# Patient Record
Sex: Female | Born: 1975 | Race: Black or African American | Hispanic: No | Marital: Married | Smoking: Never smoker
Health system: Southern US, Community
[De-identification: ages and names within clinical notes are randomized; demographics above are authoritative.]

## PROBLEM LIST (undated history)

## (undated) HISTORY — PX: TUBAL LIGATION: SHX77

## (undated) HISTORY — PX: BREAST SURGERY: SHX581

---

## 1997-08-05 ENCOUNTER — Observation Stay (HOSPITAL_COMMUNITY): Admission: AD | Admit: 1997-08-05 | Discharge: 1997-08-05 | Payer: Self-pay | Admitting: Obstetrics & Gynecology

## 1997-08-05 ENCOUNTER — Inpatient Hospital Stay (HOSPITAL_COMMUNITY): Admission: AD | Admit: 1997-08-05 | Discharge: 1997-08-05 | Payer: Self-pay | Admitting: Obstetrics and Gynecology

## 1997-08-11 ENCOUNTER — Inpatient Hospital Stay (HOSPITAL_COMMUNITY): Admission: AD | Admit: 1997-08-11 | Discharge: 1997-08-11 | Payer: Self-pay | Admitting: Obstetrics & Gynecology

## 1997-08-15 ENCOUNTER — Inpatient Hospital Stay (HOSPITAL_COMMUNITY): Admission: AD | Admit: 1997-08-15 | Discharge: 1997-08-18 | Payer: Self-pay | Admitting: Obstetrics & Gynecology

## 1997-08-27 ENCOUNTER — Observation Stay (HOSPITAL_COMMUNITY): Admission: AD | Admit: 1997-08-27 | Discharge: 1997-08-28 | Payer: Self-pay | Admitting: *Deleted

## 1997-08-31 ENCOUNTER — Other Ambulatory Visit: Admission: RE | Admit: 1997-08-31 | Discharge: 1997-08-31 | Payer: Self-pay | Admitting: Obstetrics & Gynecology

## 1997-09-03 ENCOUNTER — Inpatient Hospital Stay (HOSPITAL_COMMUNITY): Admission: AD | Admit: 1997-09-03 | Discharge: 1997-09-03 | Payer: Self-pay | Admitting: *Deleted

## 1997-09-05 ENCOUNTER — Emergency Department (HOSPITAL_COMMUNITY): Admission: EM | Admit: 1997-09-05 | Discharge: 1997-09-05 | Payer: Self-pay | Admitting: Emergency Medicine

## 1997-09-06 ENCOUNTER — Inpatient Hospital Stay (HOSPITAL_COMMUNITY): Admission: AD | Admit: 1997-09-06 | Discharge: 1997-09-06 | Payer: Self-pay | Admitting: Obstetrics and Gynecology

## 1997-09-08 ENCOUNTER — Inpatient Hospital Stay (HOSPITAL_COMMUNITY): Admission: AD | Admit: 1997-09-08 | Discharge: 1997-09-15 | Payer: Self-pay

## 1997-09-17 ENCOUNTER — Ambulatory Visit (HOSPITAL_COMMUNITY): Admission: RE | Admit: 1997-09-17 | Discharge: 1997-09-17 | Payer: Self-pay | Admitting: Obstetrics and Gynecology

## 1997-09-17 ENCOUNTER — Ambulatory Visit (HOSPITAL_COMMUNITY): Admission: RE | Admit: 1997-09-17 | Discharge: 1997-09-17 | Payer: Self-pay | Admitting: Obstetrics & Gynecology

## 1997-09-17 ENCOUNTER — Inpatient Hospital Stay (HOSPITAL_COMMUNITY): Admission: AD | Admit: 1997-09-17 | Discharge: 1997-09-17 | Payer: Self-pay | Admitting: *Deleted

## 1997-09-21 ENCOUNTER — Ambulatory Visit (HOSPITAL_COMMUNITY): Admission: RE | Admit: 1997-09-21 | Discharge: 1997-09-21 | Payer: Self-pay | Admitting: Obstetrics and Gynecology

## 1997-09-24 ENCOUNTER — Ambulatory Visit (HOSPITAL_COMMUNITY): Admission: RE | Admit: 1997-09-24 | Discharge: 1997-09-24 | Payer: Self-pay | Admitting: Obstetrics and Gynecology

## 1997-09-28 ENCOUNTER — Ambulatory Visit (HOSPITAL_COMMUNITY): Admission: RE | Admit: 1997-09-28 | Discharge: 1997-09-28 | Payer: Self-pay | Admitting: Obstetrics and Gynecology

## 1997-10-01 ENCOUNTER — Ambulatory Visit (HOSPITAL_COMMUNITY): Admission: RE | Admit: 1997-10-01 | Discharge: 1997-10-01 | Payer: Self-pay | Admitting: Obstetrics and Gynecology

## 1997-10-05 ENCOUNTER — Ambulatory Visit (HOSPITAL_COMMUNITY): Admission: RE | Admit: 1997-10-05 | Discharge: 1997-10-05 | Payer: Self-pay | Admitting: Obstetrics and Gynecology

## 1997-10-11 ENCOUNTER — Inpatient Hospital Stay (HOSPITAL_COMMUNITY): Admission: AD | Admit: 1997-10-11 | Discharge: 1997-10-11 | Payer: Self-pay | Admitting: Obstetrics & Gynecology

## 1997-10-13 ENCOUNTER — Inpatient Hospital Stay (HOSPITAL_COMMUNITY): Admission: AD | Admit: 1997-10-13 | Discharge: 1997-10-13 | Payer: Self-pay | Admitting: Obstetrics and Gynecology

## 1997-10-13 ENCOUNTER — Encounter: Payer: Self-pay | Admitting: Obstetrics and Gynecology

## 1997-10-17 ENCOUNTER — Inpatient Hospital Stay (HOSPITAL_COMMUNITY): Admission: AD | Admit: 1997-10-17 | Discharge: 1997-10-17 | Payer: Self-pay | Admitting: Obstetrics and Gynecology

## 1997-10-29 ENCOUNTER — Ambulatory Visit (HOSPITAL_COMMUNITY): Admission: RE | Admit: 1997-10-29 | Discharge: 1997-10-29 | Payer: Self-pay | Admitting: Obstetrics & Gynecology

## 1997-11-25 ENCOUNTER — Inpatient Hospital Stay (HOSPITAL_COMMUNITY): Admission: AD | Admit: 1997-11-25 | Discharge: 1997-11-25 | Payer: Self-pay | Admitting: Obstetrics and Gynecology

## 1997-12-03 ENCOUNTER — Observation Stay (HOSPITAL_COMMUNITY): Admission: AD | Admit: 1997-12-03 | Discharge: 1997-12-04 | Payer: Self-pay | Admitting: Obstetrics & Gynecology

## 1997-12-11 ENCOUNTER — Inpatient Hospital Stay (HOSPITAL_COMMUNITY): Admission: AD | Admit: 1997-12-11 | Discharge: 1997-12-11 | Payer: Self-pay | Admitting: Obstetrics & Gynecology

## 1997-12-22 ENCOUNTER — Observation Stay (HOSPITAL_COMMUNITY): Admission: AD | Admit: 1997-12-22 | Discharge: 1997-12-23 | Payer: Self-pay | Admitting: Obstetrics and Gynecology

## 1997-12-26 ENCOUNTER — Inpatient Hospital Stay (HOSPITAL_COMMUNITY): Admission: AD | Admit: 1997-12-26 | Discharge: 1997-12-27 | Payer: Self-pay | Admitting: Obstetrics & Gynecology

## 1998-01-06 ENCOUNTER — Inpatient Hospital Stay (HOSPITAL_COMMUNITY): Admission: AD | Admit: 1998-01-06 | Discharge: 1998-01-06 | Payer: Self-pay | Admitting: Obstetrics & Gynecology

## 1998-01-11 ENCOUNTER — Ambulatory Visit (HOSPITAL_COMMUNITY): Admission: RE | Admit: 1998-01-11 | Discharge: 1998-01-11 | Payer: Self-pay | Admitting: Obstetrics & Gynecology

## 1998-01-15 ENCOUNTER — Inpatient Hospital Stay (HOSPITAL_COMMUNITY): Admission: AD | Admit: 1998-01-15 | Discharge: 1998-01-23 | Payer: Self-pay | Admitting: Obstetrics and Gynecology

## 1998-01-16 ENCOUNTER — Encounter: Payer: Self-pay | Admitting: Obstetrics and Gynecology

## 1998-01-27 ENCOUNTER — Inpatient Hospital Stay (HOSPITAL_COMMUNITY): Admission: AD | Admit: 1998-01-27 | Discharge: 1998-02-03 | Payer: Self-pay | Admitting: Obstetrics and Gynecology

## 1998-01-27 ENCOUNTER — Encounter: Payer: Self-pay | Admitting: Obstetrics and Gynecology

## 1998-02-25 ENCOUNTER — Inpatient Hospital Stay (HOSPITAL_COMMUNITY): Admission: AD | Admit: 1998-02-25 | Discharge: 1998-02-27 | Payer: Self-pay | Admitting: *Deleted

## 1998-05-07 ENCOUNTER — Inpatient Hospital Stay (HOSPITAL_COMMUNITY): Admission: AD | Admit: 1998-05-07 | Discharge: 1998-05-07 | Payer: Self-pay | Admitting: Obstetrics & Gynecology

## 1998-09-06 ENCOUNTER — Observation Stay (HOSPITAL_COMMUNITY): Admission: AD | Admit: 1998-09-06 | Discharge: 1998-09-07 | Payer: Self-pay | Admitting: Obstetrics and Gynecology

## 1998-09-12 ENCOUNTER — Encounter: Payer: Self-pay | Admitting: Obstetrics & Gynecology

## 1998-09-12 ENCOUNTER — Inpatient Hospital Stay (HOSPITAL_COMMUNITY): Admission: AD | Admit: 1998-09-12 | Discharge: 1998-09-18 | Payer: Self-pay | Admitting: Obstetrics & Gynecology

## 1998-09-28 ENCOUNTER — Inpatient Hospital Stay (HOSPITAL_COMMUNITY): Admission: AD | Admit: 1998-09-28 | Discharge: 1998-10-03 | Payer: Self-pay | Admitting: Obstetrics & Gynecology

## 1998-10-11 ENCOUNTER — Other Ambulatory Visit: Admission: RE | Admit: 1998-10-11 | Discharge: 1998-10-11 | Payer: Self-pay | Admitting: *Deleted

## 1998-10-15 ENCOUNTER — Inpatient Hospital Stay (HOSPITAL_COMMUNITY): Admission: AD | Admit: 1998-10-15 | Discharge: 1998-10-20 | Payer: Self-pay | Admitting: Obstetrics and Gynecology

## 1998-10-17 ENCOUNTER — Encounter: Payer: Self-pay | Admitting: Obstetrics and Gynecology

## 1998-11-06 ENCOUNTER — Inpatient Hospital Stay (HOSPITAL_COMMUNITY): Admission: AD | Admit: 1998-11-06 | Discharge: 1998-11-06 | Payer: Self-pay | Admitting: Obstetrics and Gynecology

## 1998-11-09 ENCOUNTER — Inpatient Hospital Stay (HOSPITAL_COMMUNITY): Admission: AD | Admit: 1998-11-09 | Discharge: 1998-11-09 | Payer: Self-pay | Admitting: *Deleted

## 1998-11-13 ENCOUNTER — Inpatient Hospital Stay (HOSPITAL_COMMUNITY): Admission: AD | Admit: 1998-11-13 | Discharge: 1998-11-13 | Payer: Self-pay | Admitting: *Deleted

## 1999-03-05 ENCOUNTER — Inpatient Hospital Stay (HOSPITAL_COMMUNITY): Admission: AD | Admit: 1999-03-05 | Discharge: 1999-03-05 | Payer: Self-pay | Admitting: *Deleted

## 1999-03-14 ENCOUNTER — Inpatient Hospital Stay (HOSPITAL_COMMUNITY): Admission: AD | Admit: 1999-03-14 | Discharge: 1999-03-14 | Payer: Self-pay | Admitting: *Deleted

## 1999-03-30 ENCOUNTER — Encounter (INDEPENDENT_AMBULATORY_CARE_PROVIDER_SITE_OTHER): Payer: Self-pay | Admitting: Specialist

## 1999-03-30 ENCOUNTER — Inpatient Hospital Stay (HOSPITAL_COMMUNITY): Admission: AD | Admit: 1999-03-30 | Discharge: 1999-04-05 | Payer: Self-pay | Admitting: Obstetrics & Gynecology

## 2000-02-01 ENCOUNTER — Other Ambulatory Visit: Admission: RE | Admit: 2000-02-01 | Discharge: 2000-02-01 | Payer: Self-pay | Admitting: Obstetrics and Gynecology

## 2000-06-19 ENCOUNTER — Encounter (INDEPENDENT_AMBULATORY_CARE_PROVIDER_SITE_OTHER): Payer: Self-pay | Admitting: Specialist

## 2000-06-19 ENCOUNTER — Ambulatory Visit (HOSPITAL_COMMUNITY): Admission: RE | Admit: 2000-06-19 | Discharge: 2000-06-19 | Payer: Self-pay | Admitting: *Deleted

## 2001-03-08 ENCOUNTER — Emergency Department (HOSPITAL_COMMUNITY): Admission: EM | Admit: 2001-03-08 | Discharge: 2001-03-09 | Payer: Self-pay | Admitting: Emergency Medicine

## 2003-05-02 ENCOUNTER — Inpatient Hospital Stay (HOSPITAL_COMMUNITY): Admission: AD | Admit: 2003-05-02 | Discharge: 2003-05-03 | Payer: Self-pay | Admitting: Obstetrics & Gynecology

## 2003-05-21 ENCOUNTER — Other Ambulatory Visit: Admission: RE | Admit: 2003-05-21 | Discharge: 2003-05-21 | Payer: Self-pay | Admitting: Obstetrics and Gynecology

## 2006-04-28 ENCOUNTER — Inpatient Hospital Stay (HOSPITAL_COMMUNITY): Admission: AD | Admit: 2006-04-28 | Discharge: 2006-04-29 | Payer: Self-pay | Admitting: Obstetrics & Gynecology

## 2006-12-22 ENCOUNTER — Inpatient Hospital Stay (HOSPITAL_COMMUNITY): Admission: AD | Admit: 2006-12-22 | Discharge: 2006-12-22 | Payer: Self-pay | Admitting: Obstetrics & Gynecology

## 2006-12-27 ENCOUNTER — Inpatient Hospital Stay (HOSPITAL_COMMUNITY): Admission: AD | Admit: 2006-12-27 | Discharge: 2006-12-31 | Payer: Self-pay | Admitting: Obstetrics & Gynecology

## 2007-01-05 ENCOUNTER — Inpatient Hospital Stay (HOSPITAL_COMMUNITY): Admission: AD | Admit: 2007-01-05 | Discharge: 2007-01-07 | Payer: Self-pay | Admitting: Obstetrics

## 2007-01-15 ENCOUNTER — Inpatient Hospital Stay (HOSPITAL_COMMUNITY): Admission: AD | Admit: 2007-01-15 | Discharge: 2007-01-18 | Payer: Self-pay | Admitting: Obstetrics

## 2007-01-20 ENCOUNTER — Inpatient Hospital Stay (HOSPITAL_COMMUNITY): Admission: AD | Admit: 2007-01-20 | Discharge: 2007-01-23 | Payer: Self-pay | Admitting: Obstetrics

## 2007-02-04 ENCOUNTER — Inpatient Hospital Stay (HOSPITAL_COMMUNITY): Admission: AD | Admit: 2007-02-04 | Discharge: 2007-02-05 | Payer: Self-pay | Admitting: Obstetrics

## 2007-02-12 ENCOUNTER — Inpatient Hospital Stay (HOSPITAL_COMMUNITY): Admission: AD | Admit: 2007-02-12 | Discharge: 2007-02-16 | Payer: Self-pay | Admitting: Obstetrics & Gynecology

## 2007-02-23 ENCOUNTER — Observation Stay (HOSPITAL_COMMUNITY): Admission: AD | Admit: 2007-02-23 | Discharge: 2007-02-24 | Payer: Self-pay | Admitting: Obstetrics

## 2007-03-07 ENCOUNTER — Ambulatory Visit (HOSPITAL_COMMUNITY): Admission: RE | Admit: 2007-03-07 | Discharge: 2007-03-07 | Payer: Self-pay | Admitting: Obstetrics

## 2007-03-07 ENCOUNTER — Inpatient Hospital Stay (HOSPITAL_COMMUNITY): Admission: AD | Admit: 2007-03-07 | Discharge: 2007-03-08 | Payer: Self-pay | Admitting: Obstetrics & Gynecology

## 2007-05-14 ENCOUNTER — Ambulatory Visit (HOSPITAL_COMMUNITY): Admission: RE | Admit: 2007-05-14 | Discharge: 2007-05-14 | Payer: Self-pay | Admitting: Obstetrics

## 2007-06-05 ENCOUNTER — Ambulatory Visit (HOSPITAL_COMMUNITY): Admission: RE | Admit: 2007-06-05 | Discharge: 2007-06-05 | Payer: Self-pay | Admitting: Obstetrics

## 2007-06-12 ENCOUNTER — Ambulatory Visit (HOSPITAL_COMMUNITY): Admission: RE | Admit: 2007-06-12 | Discharge: 2007-06-12 | Payer: Self-pay | Admitting: Obstetrics

## 2007-06-13 ENCOUNTER — Ambulatory Visit (HOSPITAL_COMMUNITY): Admission: RE | Admit: 2007-06-13 | Discharge: 2007-06-13 | Payer: Self-pay | Admitting: Obstetrics

## 2007-06-17 ENCOUNTER — Ambulatory Visit (HOSPITAL_COMMUNITY): Admission: RE | Admit: 2007-06-17 | Discharge: 2007-06-17 | Payer: Self-pay | Admitting: Obstetrics

## 2007-06-20 ENCOUNTER — Ambulatory Visit (HOSPITAL_COMMUNITY): Admission: RE | Admit: 2007-06-20 | Discharge: 2007-06-20 | Payer: Self-pay | Admitting: Obstetrics

## 2007-06-24 ENCOUNTER — Ambulatory Visit (HOSPITAL_COMMUNITY): Admission: RE | Admit: 2007-06-24 | Discharge: 2007-06-24 | Payer: Self-pay | Admitting: Obstetrics

## 2007-06-28 ENCOUNTER — Ambulatory Visit (HOSPITAL_COMMUNITY): Admission: RE | Admit: 2007-06-28 | Discharge: 2007-06-28 | Payer: Self-pay | Admitting: Obstetrics

## 2007-07-01 ENCOUNTER — Ambulatory Visit (HOSPITAL_COMMUNITY): Admission: RE | Admit: 2007-07-01 | Discharge: 2007-07-01 | Payer: Self-pay | Admitting: Obstetrics

## 2007-07-19 ENCOUNTER — Inpatient Hospital Stay (HOSPITAL_COMMUNITY): Admission: AD | Admit: 2007-07-19 | Discharge: 2007-07-19 | Payer: Self-pay | Admitting: Obstetrics

## 2007-07-29 ENCOUNTER — Inpatient Hospital Stay (HOSPITAL_COMMUNITY): Admission: RE | Admit: 2007-07-29 | Discharge: 2007-08-02 | Payer: Self-pay | Admitting: Obstetrics

## 2007-07-30 ENCOUNTER — Encounter: Payer: Self-pay | Admitting: Obstetrics

## 2007-08-04 ENCOUNTER — Inpatient Hospital Stay (HOSPITAL_COMMUNITY): Admission: AD | Admit: 2007-08-04 | Discharge: 2007-08-07 | Payer: Self-pay | Admitting: Obstetrics & Gynecology

## 2007-08-05 ENCOUNTER — Encounter: Payer: Self-pay | Admitting: Obstetrics & Gynecology

## 2009-04-25 ENCOUNTER — Emergency Department (HOSPITAL_COMMUNITY)
Admission: EM | Admit: 2009-04-25 | Discharge: 2009-04-26 | Payer: Self-pay | Source: Home / Self Care | Admitting: Emergency Medicine

## 2009-12-08 ENCOUNTER — Emergency Department (HOSPITAL_BASED_OUTPATIENT_CLINIC_OR_DEPARTMENT_OTHER): Admission: EM | Admit: 2009-12-08 | Discharge: 2009-12-08 | Payer: Self-pay | Admitting: Emergency Medicine

## 2010-01-30 ENCOUNTER — Encounter: Payer: Self-pay | Admitting: Obstetrics

## 2010-05-24 NOTE — Discharge Summary (Signed)
NAMEMAILEN, NEWBORN                 ACCOUNT NO.:  [id number]   MEDICAL RECORD NO.:  [id number]          PATIENT TYPE:  INP   LOCATION:  9306                          FACILITY:  [facility]   PHYSICIAN:  Charles A. Clearance Coots, M.D.DATE OF BIRTH:  [birth year]   DATE OF ADMISSION:  01/20/2007  DATE OF DISCHARGE:  01/23/2007                               DISCHARGE SUMMARY   ADMITTING DIAGNOSES:  1. Twelve weeks gestation.  2. Hyperemesis gravidarum.   DISCHARGE DIAGNOSES:  1. Twelve weeks gestation.  2. Hyperemesis gravidarum, much improved after IV fluid hydration and      supportive management.  Discharged home undelivered at [redacted] weeks      gestation in good condition.   REASON FOR ADMISSION:  A 35 year old G5, P2 at [redacted] weeks gestation.  The  patient has had persistent nausea and vomiting during this pregnancy and  has had a recent admission to the hospital and was discharged home.  The  patient, over the past 24 hours, had had increased nausea and vomiting  to the point where she could not keep anything down and was getting  dehydrated.   PAST MEDICAL HISTORY:   SURGERY:  Therapeutic abortion.   ILLNESSES:  None.   MEDICATIONS:  1. Colace.  2. Prednisone.  3. Zofran.   ALLERGIES:  SULFA.   SOCIAL HISTORY:  Single.  Negative for tobacco, alcohol or recreational  drug use.   PHYSICAL EXAMINATION:  GENERAL:  Well-nourished, well-developed female  in no acute distress.  VITAL SIGNS:  Temperature 97.3, pulse 98, blood pressure 98/67.  LUNGS:  Clear to auscultation bilaterally.  HEART:  Regular rate and rhythm.  ABDOMEN:  Nontender.  Doppler fetal  heart rate 151-60 beats per minute.  PELVIC EXAM:  Omitted.   ADMITTING LABORATORY VALUES:  Urinalysis was significant for specific  gravity of greater than 1.030, small bilirubin greater than 80 ketones,  100 of protein.  Comprehensive metabolic panel was significant for  potassium of 3.3, sodium 136, SGOT of 19, SGPT of 43, BUN of five  and  creatinine 0.73.   IMPRESSION:  1. Twelve weeks gestation.  2. Hyperemesis gravidarum.   PLAN:  Admit, IV fluid hydration, supportive hyperemesis therapy.   HOSPITAL COURSE:  The patient was admitted and started on IV fluid  hydration, Zofran IV for hyperemesis.  She was also restarted on a  steroid taper which she has recently completed.  She responded well to  therapy, and by hospital day #2 she was starting to tolerate clear  liquids, and by hospital day #3, she was eating a regular diet.  The  patient was discharged home on hospital day #4, tolerating a regular  diet, no significant nausea, in good condition at [redacted] weeks gestation.   DISCHARGE DISPOSITION:  Medications - continue Zofran and steroid taper.  [facility] home [facility] was consulted to coordinate IV fluid hydration at  home.  The patient was given routine instructions for hyperemesis  management with diet.  She was instructed to call the office for a  follow-up appointment in 2 weeks.      [name]  Julio Alm, M.D.  Electronically Signed     CAH/MEDQ  D:  01/23/2007  T:  01/23/2007  Job:  [id number]

## 2010-05-24 NOTE — Op Note (Signed)
NAME:  [name], [name]                 ACCOUNT NO.:  [id number]   MEDICAL RECORD NO.:  [id number]          PATIENT TYPE:  INP   LOCATION:  9134                          [facility]  [facility]   PHYSICIAN:  Charles A. Clearance Coots, M.D.DATE OF BIRTH:  [birth year]   DATE OF PROCEDURE:  07/30/2007  DATE OF DISCHARGE:                               OPERATIVE REPORT   PREOPERATIVE DIAGNOSES:  1. Arrest of dilatation and descent.  2. Persistent severe variable fetal heart rate decelerations.  3. Desires sterilization.   POSTOPERATIVE DIAGNOSES:  1. Arrest of dilatation and descent.  2. Persistent severe variable fetal heart rate decelerations.  3. Desires sterilization.   PROCEDURE:  Primary low-transverse cesarean section, bilateral partial  salpingectomy.   SURGEON:  Charles A. Clearance Coots, MD.   ASSISTANT:  Dian Queen, Surgical Technician.   ANESTHESIA:  Epidural.   ESTIMATED BLOOD LOSS:  700 mL   IV FLUIDS:  2500 mL   URINE OUTPUT:  125 mL   COMPLICATIONS:  None.   DRAINS:  Foley to gravity.   FINDINGS:  Viable female at 71, Apgars of 8 at one minute and 8 at one  minutes, and weight of 7 pounds and 10 ounces.  Normal uterus, ovaries,  and fallopian tubes.   OPERATION:  The patient was brought to the operating room and after  satisfactory redosing of the epidural, the abdomen was prepped and  draped in the usual sterile fashion.  Pfannenstiel skin incision was  made with a scalpel that was deepened down to the fascia with a scalpel.  Fascia was nicked in the midline and the fascial incision was extended  to the left and to the right with curved Mayo scissors.  The superior  and inferior fascial edges were taken off of the rectus muscles, both  blunt and sharp dissection.  Rectus muscle was bluntly divided in the  midline and then sharply extended superiorly and inferiorly being  careful to avoid the urinary bladder inferiorly.  The peritoneum was  then entered digitally and was digitally  extended to the left and to the  right.  The bladder blade was positioned and vesicouterine fold of  peritoneum above the reflection of the urinary bladder was grasped with  forceps and was incised and undermined with Metzenbaum scissors.  Incision was extended to the left and to the right with the Metzenbaum  scissors.  The bladder flap was then bluntly developed and the bladder  blade was repositioned in front of the urinary bladder, placing it well  out of the operative field.  The uterus was then entered transversely in  the lower uterine segment with the scalpel.  Clear amniotic fluid was  expelled.  The uterine incision was extended to the left and to the  right with the bandage scissors.  The occiput was noted to be left  occiput posterior and the occiput was rotated anteriorly into the  incision.  The vertex was then delivered with the aid of fundal pressure  from the assistant.  The infant's mouth and nose were suctioned with a  suction bulb and delivery was  completed with the aid of fundal pressure  from the assistant.  Umbilical cord was doubly clamped and cut and the  infant was handed off to nursery staff.  The placenta was then manually  removed from the uterus intact.  The endometrial surface was thoroughly  debrided with a dry lap sponge.  The edges of the uterine incision were  grasped with a ring forceps, and the uterus was closed with a continuous  interlocking suture of 0-Monocryl from each corner to the center.  Hemostasis was excellent.  Attention was then turned above to the tubal  ligation procedure.  The left fallopian tube was identified and was  grasped with a Babcock clamp, knuckle of tube beneath the Babcock clamp  and the isthmic area of the tube was suture ligated through the  mesosalpinx and the knuckle of tube above the knot was excised with  Metzenbaum scissors and submitted to pathology for evaluation.  There  was no active bleeding from the tubal stumps  and therefore placed back  in a normal anatomic position.  Same procedure was performed on the  opposite side without complications.  The pelvic cavity was then  thoroughly irrigated with warm saline solution.  The bladder flap was  closed with a continuous suture of 3-0 Monocryl.  The abdomen was then  closed as follows.  Peritoneum was closed with a continuous suture of 2-  0 Vicryl.  Fascia was closed with a continuous suture of 0-Vicryl.  Subcutaneous tissue was thoroughly irrigated with warm saline solution.  All areas of subcutaneous bleeding were coagulated with the Bovie.  Skin  was then closed with a surgical stainless steel staples.  Sterile  bandage was applied to the incision closure.  Surgical technician  indicated that all needle, sponge, and instrument counts were correct  x2.  The patient tolerated the procedure well and was transported to the  recovery room in satisfactory condition.      Charles A. Clearance Coots, M.D.  Electronically Signed     CAH/MEDQ  D:  07/30/2007  T:  07/31/2007  Job:  [id number]

## 2010-05-24 NOTE — Discharge Summary (Signed)
NAMEMarland Andersen  GWENDALYNN, ECKSTROM                 ACCOUNT NO.:  [id number]   MEDICAL RECORD NO.:  [id number]          PATIENT TYPE:  INP   LOCATION:  9313                          [facility]  [facility]   PHYSICIAN:  Roseanna Rainbow, M.D.DATE OF BIRTH:  01/26/1975   DATE OF ADMISSION:  12/27/2006  DATE OF DISCHARGE:  12/31/2006                               DISCHARGE SUMMARY   CHIEF COMPLAINT:  The patient is a 35 year old para 1 with 8-plus weeks  amenorrhea and a positive urine pregnancy test complaining of nausea,  vomiting.   HISTORY OF PRESENT ILLNESS:  She presented with similar complaints  approximately one week prior.  She was given IV hydration in the  maternity admissions unit and discharged to home.  She has tried Unisom.  She is unable to tolerate fluids.  She has a history of hyperemesis with  her first pregnancy, requiring a feeding tube, and that pregnancy was  complicated by an intrauterine fetal demise.  During the second  pregnancy, she received a steroid taper and her symptoms resolved in the  second trimester.   OBSTETRICAL HISTORY:  Please see the above.  There is a history of two  spontaneous abortions.  There is a history of one voluntary termination  of pregnancy.   PAST MEDICAL HISTORY:  She denies.   SOCIAL HISTORY:  No tobacco, ethanol or drug use.   ALLERGIES:  SULFA.   PHYSICAL EXAMINATION:  VITAL SIGNS:  Stable, afebrile.  GENERAL:  No apparent distress.  ABDOMEN:  Nontender.  PELVIC:  Exam deferred.   LABORATORIES:  Urinalysis:  Specific gravity greater than 1.030, ketones  greater than 80, white blood cell count 10.8, hemoglobin 12.6, potassium  3.3, SGOT and SGPT 43 and 71 respectively.   ASSESSMENT:  Hyperemesis gravidarum, contraction alkalosis, early  intrauterine pregnancy, moderate dehydration.   PLAN:  Admission, antiemetics, IV hydration, daily weights, nutritional  consult.  The patient was admitted.  Her nausea gradually improved.  Her  diet was  then advanced to a [employer].  She was given a scopolamine  patch and a steroid taper.  She was then discharged to home.   DISCHARGE DIAGNOSIS:  Hyperemesis gravidarum.   CONDITION ON DISCHARGE:  Improved.   DIET:  Bland diet.   ACTIVITY:  Modified bed rest.   MEDICATIONS:  Included methylprednisolone taper.   DISPOSITION:  The patient was to follow up in the office in 1 week.      Roseanna Rainbow, M.D.  Electronically Signed     LAJ/MEDQ  D:  02/01/2007  T:  02/02/2007  Job:  [id number]

## 2010-05-24 NOTE — Discharge Summary (Signed)
NAMEMarland Kitchen  Robin, Andersen                 ACCOUNT NO.:  [id number]   MEDICAL RECORD NO.:  [id number]          PATIENT TYPE:  INP   LOCATION:  9311                          [facility]   PHYSICIAN:  Charles A. Clearance Coots, M.D.DATE OF BIRTH:  09-26-1975   DATE OF ADMISSION:  01/15/2007  DATE OF DISCHARGE:  01/18/2007                               DISCHARGE SUMMARY   ADMITTING DIAGNOSES:  1. Eleven week gestation.  2. Hyperemesis gravidarum.   DISCHARGE DIAGNOSES:  1. Eleven week gestation.  2. Hyperemesis gravidarum.   Discharged home at [redacted] weeks gestation much improved after supportive  therapy.  Discharged home in good condition.   REASON FOR ADMISSION:  A 31-year black female, G5, P2, with estimated  date of confinement of August 02, 2007 who presented to triage with nausea  and vomiting and inability to keep anything down.   PAST MEDICAL HISTORY:  Surgery:  None.  Illnesses:  None.   MEDICATIONS:  Prednisone, Phenergan and Reglan.   ALLERGIES:  SULFA.   PHYSICAL EXAMINATION:  GENERAL APPEARANCE:  A well-nourished, well-  developed female in no acute distress.  VITAL SIGNS:  She is afebrile.  Vital signs are stable.  LUNGS:  Clear to auscultation bilaterally.  HEART:  Regular rate and rhythm.  ABDOMEN:  Nontender.  Doppler fetal heart rate was 150-160 beats per  minute.   HOSPITAL COURSE:  The patient was admitted and started on IV fluid  hydration and supportive antiemetic management.  She responded well to  therapy and by hospital day #2 was able to tolerate a regular diet with  no nausea and vomiting.  The patient was, therefore, discharged home on  hospital day #3 tolerating diet an no significant nausea.   DISCHARGE DISPOSITION:  Medications:  Continue the prednisone taper and  continue Zofran as needed for nausea.  Written instructions were given  for discharge with nausea and vomiting.  The patient will call the  office for a follow-up appointment in two weeks      Charles A. Clearance Coots, M.D.  Electronically Signed     CAH/MEDQ  D:  01/18/2007  T:  01/18/2007  Job:  [id number]

## 2010-05-27 NOTE — Discharge Summary (Signed)
[facility]  Patient:    [name], [name]                        MRN: [id number] Adm. Date:  [id number] Disc. Date: 04/05/99 Attending:  Leonard Schwartz Dictator:   Erin Sons, C.N.M.                           Discharge Summary  ADMISSION DIAGNOSES:          1. Intrauterine pregnancy at 37-2/7 weeks.                               2. History of fetal demise.                               3. Severe hyperemesis.                               4. Positive Group B Strep.  DISCHARGE DIAGNOSES:          1. Intrauterine pregnancy at 37-4/7 weeks.                               2. Positive Group B Strep.                               3. History of intrauterine fetal demise in prior                                  pregnancy.                               4. Prolonged second stage.                               5. Maternal fatigue.                               6. Profound neonatal anemia.  PROCEDURE:                    1) Cervical ripening with Cytotec.  2) Pitocin induction.  3) Serial induction.  4) Artificial rupture of membranes.  5) Pitocin augmentation.  6) Amnioinfusion.  7) Vacuum-assisted vaginal delivery.  8) Epidural anesthesia.  HOSPITAL COURSE:              [name] is a 35 year old, gravida 2, para 0-1-0-0, who was admitted at 37 weeks for induction secondary to previous IUFD and severe hyperemesis.  Pregnancy had been remarkable for:  1) Severe hyperemesis. 2) History of IUFD.  3) Positive ANA.  4) Pyelonephritis.  5) Positive Group B Strep.  The patient was initially followed during her pregnancy at [facility] and Gynecology and then was transferred to [facility] service secondary to severe hyperemesis.  Over the last two weeks, she  requested to be transferred back to [facility]  and Gynecology secondary to transportation issues.  Her last pregnancy had also  been remarkable for the severe hyperemesis as well as an intrauterine fetal demise at approximately 35 weeks.  On admission, on March 30, 1999, the cervix was fingertip, 50%. Cytotec was placed that night and Pitocin was begun the next morning.  By the late evening, fetal heart rate was reactive and contractions were one to five minutes on 20 units of Pitocin.  The cervix was 1 cm, 50%.  Plan of care options were reviewed with the patient and the patient elected to discontinue Pitocin and allow for rest that night.  By the next morning, her cervix was unchanged.  Options for care were reviewed with the patient and the patient wishes to proceed with Pitocin induction. By that afternoon, her cervix was 1, 50%, and ballotable.  Options were then again reviewed with the patient that evening.  The patient elected to discontinue Pitocin that evening and have Cytotec placed q.4h. that night.  She received two doses f Cytotec.  By the next morning, the cervix was 2 cm, 70%, and vertex was at -3 station.  Artificial rupture of membranes was accomplished with flecks of blood  noted.  Pitocin was continued.  Fetal heart rate was within normal limits.  The  patient the afternoon of March 24, did have some fetal heart rate changes including tachycardia and some decellerations with slightly late return.  The cervix at that time was 4.  Vertex was not very well applied at that time.  Intrauterine pressure catheter was placed for amnioinfusion and scalp lead was placed.  The patient progressed to 5 cm by approximately 4:30 that afternoon.  Amnioinfusion was begun. There were some sporadic late decellerations noted.  [place] units were inadequate.  There was no repetitiion to the late decellerations.  The rest of he evening the patient demonstrated fetal heart rates that were reassuring, although there were some very sporadic late decellerations noted.  The cervix was 4 cm, 0 to 90%,  with a vertex at -1 station by 6:30 on the evening of March 24.  As the  evening progressed, there were again scattered late decellerations, but with fewer than 1/2 of the contractions.  Temperature was 100.4 with Tylenol and ice administered to decrease the tachycardic fetal heart rate baseline.  Beta Strep  prophylaxis had been continuing since artificial rupture of membranes.  The patient became completely dilated at 11 p.m. with the vertex at +2 station.  There were  variables noted with pushing, although, most had good return to baseline.  The patient then eventually began to demonstrate significant fatigue and at 1:20 a.m. a vacuum-assisted vaginal delivery was accomplished under existing epidural anesthesia.  There was a tight nuchal cord noted.  Findings were a viable female y the name of Jalil, weight 6 pounds 8 ounces, Apgars were 2 and 3.  The pH was 7.16. The infant was slow to respond to respiratory stimulation and was intubated at he bedside and taken to the NICU for further care.  The patient tolerated the procedure well and was taken to the recovery room in good condition.  Later that morning, the infant was improving in the NICU.  The patients physical examination was within normal limits.  She was up ad lib.  By postpartum day #1, hemoglobin was 10.5 down from 12.2.  The infant was again continuing to improve.  The patient as planning to bottlefeed and planned oral contraceptives for contraception, but may eventually want  an injectable monthly contraceptive.  By postpartum day #2, the  patient was continuing to do well, the infant was continuing to improve. Physical examination was within normal limits.  Social work had been consulted secondary to NICU infant.  The patient was deemed to have received the full benefit of her hospital stay and was discharged home.  DISCHARGE INSTRUCTIONS:       Per [facility] and  Gynecology handout.  DISCHARGE MEDICATIONS:        1. Motrin 600 mg p.o. q.6h. p.r.n. pain.                               2. Mircette one p.o. q.d.  FOLLOW-UP:                    Will occur in six weeks at [facility] and Gynecology. DD:  04/05/99 TD:  04/05/99 Job: [id number]

## 2010-05-27 NOTE — Discharge Summary (Signed)
NAMEMarland Andersen  FRANSISCA, Robin NO.:  [id number]   MEDICAL RECORD NO.:  [id number]          PATIENT TYPE:  INP   LOCATION:  9313                          [facility]   PHYSICIAN:  Roseanna Rainbow, M.D.DATE OF BIRTH:  1975/08/21   DATE OF ADMISSION:  08/04/2007  DATE OF DISCHARGE:  08/07/2007                               DISCHARGE SUMMARY   CHIEF COMPLAINT:  The patient is a 35 year old status post cesarean  delivery on July 30, 2007, complaining of lower extremity swelling.   HISTORY OF PRESENT ILLNESS:  Please see the dictated history and  physical provided by [facility] and cosigned by myself.  The impression  was rule out postpartum preeclampsia with mild pulmonary edema.  There  was no acute respiratory compromise.  The patient was then admitted to  the adult ICU.  She was started on magnesium sulfate for seizure  prophylaxis.  PIH labs were obtained.  She was diuresed with IV Lasix  and the plan was also to obtain a maternal echo.  On hospital day #1,  her uric acid was 6.3.  A BMP was 259.  Her oxygen saturations were  greater than or equal to 95% on room air.  She had been diuresed 4  liters on the Lasix.  The magnesium sulfate was continued.  A hemoglobin  was stable at 7.  This was the range that it had been in prior to  discharge from the previous hospitalization.  The maternal echo  demonstrated a normal ejection fraction.  Her blood pressures  normalized.  The magnesium sulfate was discontinued on August 06, 2007.  She was changed over to an oral diuretic.  Her electrolytes remained  stable.  She was subsequently discharged to home on August 07, 2007.   DISCHARGE DIAGNOSES:  Postpartum preeclampsia, severe anemia.   CONDITION:  Stable.   DIET:  Regular.   ACTIVITY:  Progressive activity, pelvic rest.   MEDICATIONS:  Included ibuprofen, Percocet, Lasix, K-Dur, prenatal  vitamins, and ferrous sulfate.   DISPOSITION:  The patient was to follow up in  the office in 1 week.      Roseanna Rainbow, M.D.  Electronically Signed     LAJ/MEDQ  D:  08/23/2007  T:  08/24/2007  Job:  [id number]

## 2010-05-27 NOTE — Discharge Summary (Signed)
NAME:  [name], [name]                 ACCOUNT NO.:  [id number]   MEDICAL RECORD NO.:  [id number]          PATIENT TYPE:  INP   LOCATION:  9134                          FACILITY:  [facility]   PHYSICIAN:  Charles A. Clearance Coots, M.D.DATE OF BIRTH:  [birth year]   DATE OF ADMISSION:  07/29/2007  DATE OF DISCHARGE:  08/02/2007                               DISCHARGE SUMMARY   ADMITTING DIAGNOSES:  1. A 39 weeks' gestation.  2. Previous intrauterine fetal demise at 36 weeks' gestation.  3. Induction of labor.  4. Desires permanent sterilization.   DISCHARGE DIAGNOSES:  1. A 39 weeks' gestation.  2. Previous intrauterine fetal demise at 81 weeks' gestation.  3. Induction of labor.  4. Desires permanent sterilization.  5. Status post primary low-transverse cesarean section.  6. Bilateral partial salpingectomy on July 30, 2007 for arrest of      dilatation and descent with persistent severe variable fetal heart      rate decelerations.  7. Viable female infant was delivered at 1602, Apgars of 8 at one minute      and 8 at five minutes, weight of 7 pounds and 10 ounces.  Mother      and infant discharged home in good condition.   REASON FOR ADMISSION:  A 35 year old G5, P2, estimated date of  confinement of August 04, 2007, presents for induction of labor for  previous intrauterine fetal demise at 33 weeks' gestation.   PAST MEDICAL HISTORY:   SURGERIES:  None.   ILLNESSES:  None.   MEDICATIONS:  Prenatal vitamins and Zofran.   ALLERGIES:  SULFA drugs.   SOCIAL HISTORY:  Separated.  Negative tobacco, alcohol, or recreational  drug use.   PHYSICAL EXAMINATION:  GENERAL:  Well-nourished, well-developed female,  in no acute distress, afebrile.  VITAL SIGNS:  Stable.  LUNGS:  Clear to auscultation bilaterally.  HEART:  Regular rate and rhythm.  ABDOMEN:  Gravid and nontender.  PELVIC:  Cervix 1-2 cm dilated, long, vertex, at a -3 station.   External fetal monitor revealed fetal heart rate of  140-150 beats per  minute, reactive tracing.   ADMITTING LABS:  Hemoglobin 10, hematocrit 29, white blood cell count  8500, and platelets 343,000.  RPR was nonreactive.   HOSPITAL COURSE:  The patient was admitted, started on low-dose Pitocin  per protocol, progressed to 8-cm dilatation, in occiput-posterior  position, developed variable fetal heart rate decelerations, which  became more severe and late in onset.  Fetal tachycardia also developed,  and the patient made no progress in dilatation or descent for greater  than 3 hours, and a decision was made to proceed with cesarean section  delivery for arrest of dilatation and descent and severe variable fetal  heart rate decelerations.  Primary low-transverse cesarean section and  bilateral partial salpingectomy was performed on July 30, 2007.  There  were no intraoperative complications.  Postoperative course was  uncomplicated.  The patient was discharged home on postop day #3 in good  condition.   DISCHARGE LABS:  Hemoglobin 7, hematocrit 21, white blood cell count  11,200, and platelets  256,000.  The patient did have anemia  postoperatively, but was clinically and hemodynamically stable, and had  no orthostatic blood pressure changes with ambulation or getting up and  down.  The patient was therefore started on iron therapy during the  postoperative course.   DISCHARGE DISPOSITION:   MEDICATIONS:  Continue prenatal vitamins.  Iron was prescribed for  anemia.  Percocet and ibuprofen was prescribed for pain.  Routine  written instructions were given for discharge after cesarean section.  The patient is to call office for followup appointment in 2 weeks.       Charles A. Clearance Coots, M.D.  Electronically Signed     CAH/MEDQ  D:  08/22/2007  T:  08/22/2007  Job:  [id number]

## 2010-05-27 NOTE — Op Note (Signed)
[facility]  Patient:    [name], [name]                        MRN: [id number] Proc. Date: 06/19/00 Adm. Date:  [id number] Attending:  Vikki Ports.                           Operative Report  PREOPERATIVE DIAGNOSIS:  Right axillary mass.  POSTOPERATIVE DIAGNOSIS:  Right axillary mass.  OPERATION PERFORMED:  Excision of right axillary mass.  SURGEON:  Stephenie Acres, M.D.  ANESTHESIA:  General.  DESCRIPTION OF PROCEDURE:  The patient was taken to the operating room and placed in supine position.  After adequate anesthesia was induced using MAC technique, the right axilla was prepped and draped in normal sterile fashion. Using 1% lidocaine local anesthesia, the skin and subcutaneous tissue overlying or surrounding the mass were anesthetized.  Elliptical incision was made around some of the axillary skin overlying the mass.  Dissected down in all planes until a well-encapsulated fatty mass was excised in its entirety and sent for pathological evaluation.  Adequate hemostasis was ensured and the skin was closed with subcuticular 4-0 Monocryl.  Steri-Strips and sterile dressing was applied.  The patient tolerated the procedure well and went to PACU in good condition. DD:  06/19/00 TD:  06/19/00 Job: [id number]

## 2010-05-27 NOTE — H&P (Signed)
[facility]  Patient:    [name], [name]                        MRN: [id number] Adm. Date:  [id number] Attending:  Leonard Schwartz Dictator:   Miguel Dibble, C.N.M.                         History and Physical  DATE OF BIRTH:                1975/09/06.  HISTORY OF PRESENT ILLNESS:   This is a 35 year old gravida 3, para 0-1-1-0 at 36-4/7 weeks, who was admitted for an induction secondary to having had a fetal  demise at 35-2/7 weeks with her previous pregnancy, which was in February of 2000. This pregnancy was complicated by severe hyperemesis up until approximately 16 weeks.  Her previous pregnancy was complicated by severe malnutrition and hyperemesis throughout the entire pregnancy.  She was followed initially at [facility] in their service during this pregnancy for hyperemesis that was unresponsive to antiemetic therapy.  Approximately one month ago, the patient, by her own volition, transferred back to [facility] after having fairly uneventful pregnancy since her transfer to [facility].  She stated that due to  transportation issues, she would like to deliver at [facility] and with [facility].  She has a significant history also of a positive antinuclear  antibodies and was treated with baby aspirin.  She also has a history of pyelonephritis twice during this pregnancy.  She had reactive nonstress test during the pregnancy and serial ultrasounds were within normal limits.  PRENATAL LABORATORY DATA:     Positive group beta strep.  At entry into the practice:  Her hemoglobin was 11.7, hematocrit 34.4, platelets 393,000.  Blood ype and Rh:  O-positive, Rh-antibodies negative.  VDRL nonreactive.  Rubella titer immune.  Hepatitis B surface antigen negative.  Gonorrhea and Chlamydia cultures negative.  Glucose challenge test, January 15th, was 133.  Pap smears have been  normal.  MEDICAL HISTORY:               Blood transfusion in January of two units.  UTIs n September including pyelonephritis, and pyelonephritis in December of 1998. 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  She experienced a fracture of her right foot in 1986.  ALLERGIES:                    Allergic to SULFA -- causes severe headache and vomiting.  FAMILY HISTORY:               Mother with anemia.  Maternal grandmother with non-insulin-dependent diabetes mellitus.  Cousin with seizures.  Aunt with cleft lip and palate.  OBSTETRICAL HISTORY:          February of 2000, vaginal delivery of a 6-pound baby girl at 35-2/7 weeks following a fetal demise.  This is her second pregnancy.  SOCIAL HISTORY:               Recently married to [place]. African-American. Baptist religion.  High school graduate; unemployed.  Father of the baby has two years of college; works as an International aid/development worker.  Stable monogamous relationship. Denies smoking, alcohol or drug abuse.  Has been followed by Geralyn Flash, her [facility] in the [facility].  PHYSICAL EXAMINATION:  HEENT:  Within normal limits.  LUNGS:                        Bilaterally clear.  HEART:                        Regular rate and rhythm.  ABDOMEN:                      Soft, nontender.  Mild irregular contractions and  Braxton Hicks.  PELVIC:                       Cervix is fingertip, 50% effaced per R.N. exam.  EXTREMITIES:                  Trace edema.  DTRs +1.  ASSESSMENT:                   1. Thirty-six-plus-weeks gestation with previous                                  history of stillbirth.                               2. Positive group beta streptococcus.                               3. Positive antinuclear antibodies.  PLAN:                         Admit to labor and delivery for overnight cervical ripening and plan for management of induction by Dr. Brita Romp. [place]. Routine [facility] orders including group beta strep prophylaxis when in active labor.  Anticipate normal spontaneous vaginal delivery. DD:  03/31/99 TD:  03/31/99 Job: [id number]

## 2010-05-27 NOTE — Discharge Summary (Signed)
[name], [name]                 ACCOUNT NO.:  [id number]   MEDICAL RECORD NO.:  [id number]          PATIENT TYPE:  INP   LOCATION:  9316                          FACILITY:  [facility]   PHYSICIAN:  Roseanna Rainbow, M.D.DATE OF BIRTH:  January 15, 1975   DATE OF ADMISSION:  02/12/2007  DATE OF DISCHARGE:  02/16/2007                               DISCHARGE SUMMARY   CHIEF COMPLAINT:  The patient is a 35 year old with an intrauterine  pregnancy at 15 plus weeks, complaining of nausea and vomiting.   HISTORY OF PRESENT ILLNESS:  Please see the above.   PAST MEDICAL HISTORY:  She denies.   SOCIAL HISTORY:  She denies any tobacco, ethanol, or drug use.   PAST OB/GYN HISTORY:  There is a history of hyperemesis gravidarum with  previous pregnancy and an intrauterine fetal demise.   PAST SURGICAL HISTORY:  Removal of a breast lipoma.   MEDICATIONS:  Please see the medication reconciliation form.   REVIEW OF SYSTEMS:  GI: Nausea, vomiting.   PHYSICAL EXAMINATION:  VITAL SIGNS:  Temperature 98.7, blood pressure  106/62, pulse 99, and respirations 18.  GENERAL:  No apparent distress.  ABDOMEN:  Gravid, nontender.  PELVIC:  Deferred.   LABORATORY DATA:  White blood cell count 10, hemoglobin 12.4, and  platelets 284,000.  Complete metabolic profile:  Potassium 3.3,  otherwise normal.   ASSESSMENT:  Pregnancy-related nausea and vomiting.   PLAN:  Admission, antiemetics, and IV hydration.   HOSPITAL COURSE:  The patient was admitted and she was started on Zofran  and was given crystalloid.  She was also started on Rocephin for upper  respiratory tract infection symptoms as well as antihistamines.  Her  diet was advanced to a [employer], which she subsequently tolerated.   DISCHARGE DIAGNOSIS:  Pregnancy-related nausea and vomiting.   CONDITION:  Stable.   DIET:  Bland diet.   MEDICATIONS:  1. Zofran.  2. Reglan.   DISPOSITION:  The patient was to follow up in the office in 1  week.      Roseanna Rainbow, M.D.  Electronically Signed     LAJ/MEDQ  D:  03/22/2007  T:  03/23/2007  Job:  [id number]

## 2010-09-29 LAB — COMPREHENSIVE METABOLIC PANEL
ALT: 43 — ABNORMAL HIGH
AST: 19
Albumin: 3.3 — ABNORMAL LOW
CO2: 23
Calcium: 9
Chloride: 104
GFR calc Af Amer: >60
GFR calc non Af Amer: >60
Sodium: 136
Total Bilirubin: 0.7

## 2010-09-29 LAB — URINALYSIS, ROUTINE W REFLEX MICROSCOPIC
Glucose, UA: NEGATIVE
Ketones, ur: >80 — AB
Leukocytes, UA: NEGATIVE
Specific Gravity, Urine: >1.03 — ABNORMAL HIGH
Urobilinogen, UA: 0.2

## 2010-09-29 LAB — URINE MICROSCOPIC-ADD ON

## 2010-09-30 LAB — COMPREHENSIVE METABOLIC PANEL
ALT: 53 — ABNORMAL HIGH
AST: 34
Alkaline Phosphatase: 62
CO2: 25
Calcium: 9.8
Chloride: 100
GFR calc Af Amer: >60
GFR calc non Af Amer: >60
Glucose, Bld: 97
Potassium: 3.3 — ABNORMAL LOW
Sodium: 135
Total Bilirubin: 1.2

## 2010-09-30 LAB — URINALYSIS, ROUTINE W REFLEX MICROSCOPIC
Hgb urine dipstick: NEGATIVE
Ketones, ur: 15 — AB
Nitrite: NEGATIVE
Specific Gravity, Urine: >1.03 — ABNORMAL HIGH
pH: 6

## 2010-09-30 LAB — CBC
Hemoglobin: 12.4
MCHC: 35
RBC: 3.69 — ABNORMAL LOW
WBC: 10

## 2010-09-30 LAB — URINE MICROSCOPIC-ADD ON

## 2010-10-03 LAB — COMPREHENSIVE METABOLIC PANEL
ALT: 23
CO2: 25
Calcium: 9.5
Creatinine, Ser: 0.66
GFR calc non Af Amer: >60
Glucose, Bld: 92
Sodium: 139
Total Bilirubin: 0.6

## 2010-10-03 LAB — URINALYSIS, ROUTINE W REFLEX MICROSCOPIC
Ketones, ur: >80 — AB
Leukocytes, UA: NEGATIVE
Nitrite: NEGATIVE
Urobilinogen, UA: 0.2
pH: 6

## 2010-10-03 LAB — CBC
HCT: 32.2 — ABNORMAL LOW
MCHC: 35
MCV: 95.8
WBC: 10.8 — ABNORMAL HIGH

## 2010-10-06 LAB — URINE MICROSCOPIC-ADD ON

## 2010-10-06 LAB — URINALYSIS, ROUTINE W REFLEX MICROSCOPIC
Glucose, UA: NEGATIVE
Hgb urine dipstick: NEGATIVE
Ketones, ur: >80 — AB
Protein, ur: 100 — AB

## 2010-10-06 LAB — COMPREHENSIVE METABOLIC PANEL
ALT: 16
Alkaline Phosphatase: 249 — ABNORMAL HIGH
BUN: 5 — ABNORMAL LOW
Chloride: 107
Glucose, Bld: 83
Potassium: 3.9
Sodium: 140
Total Bilirubin: 1.1

## 2010-10-06 LAB — CBC
HCT: 34 — ABNORMAL LOW
Hemoglobin: 11.4 — ABNORMAL LOW
RBC: 3.72 — ABNORMAL LOW
WBC: 11.2 — ABNORMAL HIGH

## 2010-10-07 LAB — BASIC METABOLIC PANEL
CO2: 29
CO2: 29
Calcium: 7.5 — ABNORMAL LOW
Calcium: 8.4
Chloride: 101
Chloride: 102
Creatinine, Ser: 0.85
GFR calc Af Amer: >60
GFR calc Af Amer: >60
GFR calc non Af Amer: >60
GFR calc non Af Amer: >60
Glucose, Bld: 98
Potassium: 3.4 — ABNORMAL LOW
Potassium: 3.5
Sodium: 137
Sodium: 139
Sodium: 140

## 2010-10-07 LAB — URIC ACID: Uric Acid, Serum: 6.3

## 2010-10-07 LAB — CBC
HCT: 21.3 — ABNORMAL LOW
Hemoglobin: 7.1 — CL
MCHC: 33.5
MCHC: 33.8
MCV: 90.5
Platelets: 256
Platelets: 343
Platelets: 461 — ABNORMAL HIGH
RBC: 2.32 — ABNORMAL LOW
RBC: 3.28 — ABNORMAL LOW
RDW: 14.4
RDW: 14.6
WBC: 11.2 — ABNORMAL HIGH
WBC: 8.5

## 2010-10-07 LAB — URINALYSIS, ROUTINE W REFLEX MICROSCOPIC
Leukocytes, UA: NEGATIVE
Protein, ur: NEGATIVE
Specific Gravity, Urine: <1.005 — ABNORMAL LOW
Urobilinogen, UA: 0.2

## 2010-10-07 LAB — COMPREHENSIVE METABOLIC PANEL
ALT: 28
AST: 27
Albumin: 2.2 — ABNORMAL LOW
Calcium: 8.4
Creatinine, Ser: 0.78
GFR calc Af Amer: >60
GFR calc non Af Amer: >60
Sodium: 138
Total Protein: 5.3 — ABNORMAL LOW

## 2010-10-07 LAB — URINE MICROSCOPIC-ADD ON

## 2010-10-07 LAB — B-NATRIURETIC PEPTIDE (CONVERTED LAB): Pro B Natriuretic peptide (BNP): 259 — ABNORMAL HIGH

## 2010-10-07 LAB — RPR: RPR Ser Ql: NONREACTIVE

## 2010-10-14 LAB — COMPREHENSIVE METABOLIC PANEL
ALT: 71 — ABNORMAL HIGH
AST: 43 — ABNORMAL HIGH
Albumin: 3 — ABNORMAL LOW
Albumin: 3.8
Albumin: 3.9
Alkaline Phosphatase: 53
BUN: 1 — ABNORMAL LOW
BUN: 11
BUN: 7
CO2: 24
Calcium: 9
Calcium: 9.4
Chloride: 102
Creatinine, Ser: 0.67
Creatinine, Ser: 0.75
GFR calc Af Amer: >60
GFR calc Af Amer: >60
GFR calc non Af Amer: >60
Glucose, Bld: 113 — ABNORMAL HIGH
Glucose, Bld: 173 — ABNORMAL HIGH
Glucose, Bld: 97
Potassium: 3.2 — ABNORMAL LOW
Potassium: 3.6
Sodium: 135
Total Bilirubin: 0.6
Total Protein: 7.4
Total Protein: 7.6

## 2010-10-14 LAB — CBC
HCT: 32.3 — ABNORMAL LOW
Hemoglobin: 11.3 — ABNORMAL LOW
Hemoglobin: 12
MCHC: 34.7
MCHC: 35
MCV: 95.6
Platelets: 366
Platelets: 405 — ABNORMAL HIGH
RBC: 3.61 — ABNORMAL LOW
RDW: 13.1
WBC: 11.8 — ABNORMAL HIGH

## 2010-10-14 LAB — URINALYSIS, ROUTINE W REFLEX MICROSCOPIC
Glucose, UA: NEGATIVE
Glucose, UA: NEGATIVE
Hgb urine dipstick: NEGATIVE
Ketones, ur: 40 — AB
Leukocytes, UA: NEGATIVE
Leukocytes, UA: NEGATIVE
Nitrite: NEGATIVE
Specific Gravity, Urine: >1.03 — ABNORMAL HIGH
pH: 5.5
pH: 6

## 2010-10-14 LAB — URINE MICROSCOPIC-ADD ON

## 2010-10-17 LAB — URINALYSIS, ROUTINE W REFLEX MICROSCOPIC
Hgb urine dipstick: NEGATIVE
Ketones, ur: 15 — AB
Leukocytes, UA: NEGATIVE
Nitrite: POSITIVE — AB
Protein, ur: 30 — AB
Specific Gravity, Urine: 1.015
Urobilinogen, UA: 0.2
Urobilinogen, UA: 0.2

## 2010-10-17 LAB — URINE MICROSCOPIC-ADD ON

## 2010-10-17 LAB — POCT PREGNANCY, URINE
Operator id: 28886
Preg Test, Ur: POSITIVE

## 2012-05-22 ENCOUNTER — Ambulatory Visit: Payer: BC Managed Care – PPO | Admitting: Neurology

## 2012-05-29 ENCOUNTER — Ambulatory Visit: Payer: BC Managed Care – PPO | Admitting: Neurology

## 2013-01-04 ENCOUNTER — Emergency Department (HOSPITAL_BASED_OUTPATIENT_CLINIC_OR_DEPARTMENT_OTHER): Payer: Self-pay

## 2013-01-04 ENCOUNTER — Emergency Department (HOSPITAL_BASED_OUTPATIENT_CLINIC_OR_DEPARTMENT_OTHER)
Admission: EM | Admit: 2013-01-04 | Discharge: 2013-01-04 | Disposition: A | Discharge status: Home / Self Care | Payer: BC Managed Care – PPO | Attending: Emergency Medicine | Admitting: Emergency Medicine

## 2013-01-04 ENCOUNTER — Encounter (HOSPITAL_BASED_OUTPATIENT_CLINIC_OR_DEPARTMENT_OTHER): Payer: Self-pay | Admitting: Emergency Medicine

## 2013-01-04 DIAGNOSIS — J209 Acute bronchitis, unspecified: Secondary | ICD-10-CM | POA: Insufficient documentation

## 2013-01-04 DIAGNOSIS — R519 Headache, unspecified: Secondary | ICD-10-CM

## 2013-01-04 DIAGNOSIS — J40 Bronchitis, not specified as acute or chronic: Secondary | ICD-10-CM

## 2013-01-04 DIAGNOSIS — R5381 Other malaise: Secondary | ICD-10-CM | POA: Insufficient documentation

## 2013-01-04 MED ORDER — ALBUTEROL SULFATE HFA 108 (90 BASE) MCG/ACT IN AERS
1.0000 | INHALATION_SPRAY | Freq: Four times a day (QID) | RESPIRATORY_TRACT | Status: DC | PRN
  Administered 2013-01-04: 2 via RESPIRATORY_TRACT
  Filled 2013-01-04: qty 6.7

## 2013-01-04 MED ORDER — AMOXICILLIN-POT CLAVULANATE 875-125 MG PO TABS: 1.0000 | ORAL_TABLET | Freq: Two times a day (BID) | ORAL | Status: DC

## 2013-01-04 NOTE — ED Notes (Signed)
Patient here with shortness of breath with lying and exertional. Reports that this has continued since respiratory illness a few weeks ago. Also complains of generalized weakness and headache for several days. Steady gait and neuro deficits noted on assessment.

## 2013-01-04 NOTE — ED Provider Notes (Signed)
CSN: [id number]     Arrival date & time 01/04/13  1445 History  This chart was scribed for [name]. Oletta Lamas, MD by Dorothey Baseman, ED Scribe. This patient was seen in room MHT13/MHT13 and the patient's care was started at 6:52 PM.    Chief Complaint  Patient presents with  . Shortness of Breath  . Weakness   The history is provided by the patient. No language interpreter was used.   HPI Comments: [name] is a 37 y.o. female who presents to the Emergency Department complaining of intermittent shortness of breath that is exacerbated with laying down and with exertion onset a few weeks ago after she reports that she had a URI (cough, sore throat) beginning about 4 weeks ago that has waxed and waned in intensity but didn't ever seem to go away completely. She reports that the cough is productive has been recurrent since onset. Patient reports associated sinus pressure and constant, pressure-like headache to the left temple and occipital region for the past several days. She reports taking Excedrin, Tylenol, and Mucinex at home with mild, temporary relief of her symptoms. She reports a history of tension headaches when she was younger and states that her current headache does not feel similar and is not as severe. She denies postnasal drip. She reports an allergy to Sulfa antibiotics. Patient has no other pertinent medical history.   History reviewed. No pertinent past medical history. History reviewed. No pertinent past surgical history. No family history on file. History  Substance Use Topics  . Smoking status: Never Smoker   . Smokeless tobacco: Not on file  . Alcohol Use: Not on file   OB History   Grav Para Term Preterm Abortions TAB SAB Ect Mult Living                 Review of Systems  A complete 10 system review of systems was obtained and all systems are negative except as noted in the HPI and PMH.   Allergies  Sulfa antibiotics  Home Medications  No current outpatient  prescriptions on file.  Triage Vitals: BP 115/60  Pulse 81  Temp(Src) 98.6 F (37 C) (Oral)  Resp 18  SpO2 100%  Physical Exam  Nursing note and vitals reviewed. Constitutional: She is oriented to person, place, and time. She appears well-developed and well-nourished. No distress.  HENT:  Head: Normocephalic and atraumatic.  Right Ear: Hearing and external ear normal.  Left Ear: Hearing and external ear normal.  Nose: Mucosal edema present. No rhinorrhea. Right sinus exhibits no maxillary sinus tenderness and no frontal sinus tenderness. Left sinus exhibits no maxillary sinus tenderness and no frontal sinus tenderness.  Mouth/Throat: Uvula is midline, oropharynx is clear and moist and mucous membranes are normal. Mucous membranes are not dry.  Eyes: Conjunctivae are normal.  Neck: Normal range of motion. Neck supple.  Pulmonary/Chest: Effort normal. No stridor. No respiratory distress.  Musculoskeletal: Normal range of motion.  Neurological: She is alert and oriented to person, place, and time. Coordination normal.  Skin: Skin is warm and dry. She is not diaphoretic.  Psychiatric: She has a normal mood and affect. Her behavior is normal.    ED Course  Procedures (including critical care time)  DIAGNOSTIC STUDIES: Oxygen Saturation is 100% on room air, normal by my interpretation.    COORDINATION OF CARE: 6:56 PM- Will discharge patient with an inhaler and Augmentin to manage symptoms. Discussed treatment plan with patient at bedside and patient verbalized agreement.  Labs Review Labs Reviewed - No data to display Imaging Review No results found.  EKG Interpretation   None       MDM  No diagnosis found.  I personally performed the services described in this documentation, which was scribed in my presence. The recorded information has been reviewed and considered.     [name]. Delaina Fetsch, MD 01/05/13 1226

## 2013-01-04 NOTE — Discharge Instructions (Signed)
 Sinus Headache A sinus headache is when your sinuses become clogged or swollen. Sinus headaches can range from mild to severe.  CAUSES A sinus headache can have different causes, such as:  Colds.  Sinus infections.  Allergies. SYMPTOMS  Symptoms of a sinus headache may vary and can include:  Headache.  Pain or pressure in the face.  Congested or runny nose.  Fever.  Inability to smell.  Pain in upper teeth. Weather changes can make symptoms worse. TREATMENT  The treatment of a sinus headache depends on the cause.  Sinus pain caused by a sinus infection may be treated with antibiotic medicine.  Sinus pain caused by allergies may be helped by allergy medicines (antihistamines) and medicated nasal sprays.  Sinus pain caused by congestion may be helped by flushing the nose and sinuses with saline solution. HOME CARE INSTRUCTIONS   If antibiotics are prescribed, take them as directed. Finish them even if you start to feel better.  Only take over-the-counter or prescription medicines for pain, discomfort, or fever as directed by your caregiver.  If you have congestion, use a nasal spray to help reduce pressure. SEEK IMMEDIATE MEDICAL CARE IF:  You have a fever.  You have headaches more than once a week.  You have sensitivity to light or sound.  You have repeated nausea and vomiting.  You have vision problems.  You have sudden, severe pain in your face or head.  You have a seizure.  You are confused.  Your sinus headaches do not get better after treatment. Many people think they have a sinus headache when they actually have migraines or tension headaches. MAKE SURE YOU:   Understand these instructions.  Will watch your condition.  Will get help right away if you are not doing well or get worse. Document Released: 02/03/2004 Document Revised: 03/20/2011 Document Reviewed: 03/26/2010 [facility] Patient Information 2014 [place],  [place].     Bronchitis Bronchitis is the body's way of reacting to injury and/or infection (inflammation) of the bronchi. Bronchi are the air tubes that extend from the windpipe into the lungs. If the inflammation becomes severe, it may cause shortness of breath. CAUSES  Inflammation may be caused by:  A virus.  Germs (bacteria).  Dust.  Allergens.  Pollutants and many other irritants. The cells lining the bronchial tree are covered with tiny hairs (cilia). These constantly beat upward, away from the lungs, toward the mouth. This keeps the lungs free of pollutants. When these cells become too irritated and are unable to do their job, mucus begins to develop. This causes the characteristic cough of bronchitis. The cough clears the lungs when the cilia are unable to do their job. Without either of these protective mechanisms, the mucus would settle in the lungs. Then you would develop pneumonia. Smoking is a common cause of bronchitis and can contribute to pneumonia. Stopping this habit is the single most important thing you can do to help yourself. TREATMENT   Your caregiver may prescribe an antibiotic if the cough is caused by bacteria. Also, medicines that open up your airways make it easier to breathe. Your caregiver may also recommend or prescribe an expectorant. It will loosen the mucus to be coughed up. Only take over-the-counter or prescription medicines for pain, discomfort, or fever as directed by your caregiver.  Removing whatever causes the problem (smoking, for example) is critical to preventing the problem from getting worse.  Cough suppressants may be prescribed for relief of cough symptoms.  Inhaled medicines may be  prescribed to help with symptoms now and to help prevent problems from returning.  For those with recurrent (chronic) bronchitis, there may be a need for steroid medicines. SEEK IMMEDIATE MEDICAL CARE IF:   During treatment, you develop more pus-like mucus  (purulent sputum).  You have a fever.  You become progressively more ill.  You have increased difficulty breathing, wheezing, or shortness of breath. It is necessary to seek immediate medical care if you are elderly or sick from any other disease. MAKE SURE YOU:   Understand these instructions.  Will watch your condition.  Will get help right away if you are not doing well or get worse. Document Released: 12/26/2004 Document Revised: 08/28/2012 Document Reviewed: 08/20/2012 [facility] Patient Information 2014 [place], [place].

## 2013-03-15 ENCOUNTER — Emergency Department (HOSPITAL_BASED_OUTPATIENT_CLINIC_OR_DEPARTMENT_OTHER)
Admission: EM | Admit: 2013-03-15 | Discharge: 2013-03-15 | Disposition: A | Discharge status: Home / Self Care | Payer: Self-pay | Attending: Emergency Medicine | Admitting: Emergency Medicine

## 2013-03-15 ENCOUNTER — Encounter (HOSPITAL_BASED_OUTPATIENT_CLINIC_OR_DEPARTMENT_OTHER): Payer: Self-pay | Admitting: Emergency Medicine

## 2013-03-15 DIAGNOSIS — Z3202 Encounter for pregnancy test, result negative: Secondary | ICD-10-CM | POA: Insufficient documentation

## 2013-03-15 DIAGNOSIS — Z8639 Personal history of other endocrine, nutritional and metabolic disease: Secondary | ICD-10-CM | POA: Insufficient documentation

## 2013-03-15 DIAGNOSIS — Z862 Personal history of diseases of the blood and blood-forming organs and certain disorders involving the immune mechanism: Secondary | ICD-10-CM | POA: Insufficient documentation

## 2013-03-15 DIAGNOSIS — R1012 Left upper quadrant pain: Secondary | ICD-10-CM | POA: Insufficient documentation

## 2013-03-15 LAB — URINALYSIS, ROUTINE W REFLEX MICROSCOPIC
BILIRUBIN URINE: NEGATIVE
Glucose, UA: NEGATIVE mg/dL
Hgb urine dipstick: NEGATIVE
Ketones, ur: NEGATIVE mg/dL
NITRITE: NEGATIVE
Protein, ur: NEGATIVE mg/dL
SPECIFIC GRAVITY, URINE: 1.014 (ref 1.005–1.030)
UROBILINOGEN UA: 0.2 mg/dL (ref 0.0–1.0)
pH: 7.5 (ref 5.0–8.0)

## 2013-03-15 LAB — I-STAT CHEM 8, ED
BUN: 6 mg/dL (ref 6–23)
CALCIUM ION: 1.34 mmol/L — AB (ref 1.12–1.23)
Chloride: 99 mEq/L (ref 96–112)
Creatinine, Ser: 0.9 mg/dL (ref 0.50–1.10)
GLUCOSE: 88 mg/dL (ref 70–99)
HEMATOCRIT: 38 % (ref 36.0–46.0)
HEMOGLOBIN: 12.9 g/dL (ref 12.0–15.0)
Potassium: 3.3 mEq/L — ABNORMAL LOW (ref 3.7–5.3)
Sodium: 141 mEq/L (ref 137–147)
TCO2: 28 mmol/L (ref 0–100)

## 2013-03-15 LAB — CBC WITH DIFFERENTIAL/PLATELET
Basophils Absolute: 0 10*3/uL (ref 0.0–0.1)
Basophils Relative: 0 % (ref 0–1)
Eosinophils Absolute: 0.1 10*3/uL (ref 0.0–0.7)
Eosinophils Relative: 1 % (ref 0–5)
HEMATOCRIT: 36 % (ref 36.0–46.0)
Hemoglobin: 11.9 g/dL — ABNORMAL LOW (ref 12.0–15.0)
Lymphocytes Relative: 31 % (ref 12–46)
Lymphs Abs: 2.9 10*3/uL (ref 0.7–4.0)
MCH: 31.2 pg (ref 26.0–34.0)
MCHC: 33.1 g/dL (ref 30.0–36.0)
MCV: 94.5 fL (ref 78.0–100.0)
MONO ABS: 0.6 10*3/uL (ref 0.1–1.0)
Monocytes Relative: 6 % (ref 3–12)
NEUTROS ABS: 5.8 10*3/uL (ref 1.7–7.7)
NEUTROS PCT: 62 % (ref 43–77)
Platelets: 382 10*3/uL (ref 150–400)
RBC: 3.81 MIL/uL — ABNORMAL LOW (ref 3.87–5.11)
RDW: 12.7 % (ref 11.5–15.5)
WBC: 9.4 10*3/uL (ref 4.0–10.5)

## 2013-03-15 LAB — URINE MICROSCOPIC-ADD ON

## 2013-03-15 LAB — LIPASE, BLOOD: Lipase: 21 U/L (ref 11–59)

## 2013-03-15 LAB — PREGNANCY, URINE: PREG TEST UR: NEGATIVE

## 2013-03-15 MED ORDER — SUCRALFATE 1 G PO TABS
1.0000 g | ORAL_TABLET | Freq: Once | ORAL | Status: AC
  Administered 2013-03-15: 1 g via ORAL
  Filled 2013-03-15: qty 1

## 2013-03-15 MED ORDER — PANTOPRAZOLE SODIUM 40 MG IV SOLR
40.0000 mg | Freq: Once | INTRAVENOUS | Status: AC
  Administered 2013-03-15: 40 mg via INTRAVENOUS
  Filled 2013-03-15: qty 40

## 2013-03-15 MED ORDER — OMEPRAZOLE 40 MG PO CPDR: 40.0000 mg | DELAYED_RELEASE_CAPSULE | Freq: Every day | ORAL | Status: DC

## 2013-03-15 MED ORDER — SUCRALFATE 1 G PO TABS: 1.0000 g | ORAL_TABLET | Freq: Three times a day (TID) | ORAL | Status: DC

## 2013-03-15 NOTE — ED Provider Notes (Signed)
CSN: [id number]     Arrival date & time 03/15/13  0217 History   First MD Initiated Contact with Patient 03/15/13 0320     Chief Complaint  Patient presents with  . Abdominal Pain     (Consider location/radiation/quality/duration/timing/severity/associated sxs/prior Treatment) HPI This is a 38 year old female with a one-month history of left upper quadrant abdominal pain. The pain has been intermittent. It is vaguely described but is characterized somewhat as a fullness or a need to relieve pressure. It is somewhat worse after eating. Is worse when sitting up and improved on standing up. There has been no associated nausea, vomiting or diarrhea. There's been no associated abdominal distention. The pain is only minimally worse with movement or palpation.  History reviewed. No pertinent past medical history. History reviewed. No pertinent past surgical history. Family History  Problem Relation Age of Onset  . Hyperlipidemia Mother    History  Substance Use Topics  . Smoking status: Never Smoker   . Smokeless tobacco: Never Used  . Alcohol Use: No   OB History   Grav Para Term Preterm Abortions TAB SAB Ect Mult Living   5    3  3   2      Review of Systems  All other systems reviewed and are negative.   Allergies  Sulfa antibiotics  Home Medications  No current outpatient prescriptions on file. BP 116/66  Pulse 100  Temp(Src) 98 F (36.7 C) (Oral)  Resp 18  Ht 5\' 1"  (1.549 m)  Wt 158 lb (71.668 kg)  BMI 29.87 kg/m2  SpO2 100%  LMP 02/28/2013  Physical Exam General: Well-developed, well-nourished female in no acute distress; appearance consistent with age of record HENT: normocephalic; atraumatic Eyes: pupils equal, round and reactive to light; extraocular muscles intact Neck: supple Heart: regular rate and rhythm; no murmurs, rubs or gallops Lungs: clear to auscultation bilaterally Abdomen: soft; nondistended; mild left upper quadrant tenderness; no masses or  hepatosplenomegaly; bowel sounds present Extremities: No deformity; full range of motion; pulses normal Neurologic: Awake, alert and oriented; motor function intact in all extremities and symmetric; no facial droop Skin: Warm and dry Psychiatric: Normal mood and affect    ED Course  Procedures (including critical care time)   MDM   Nursing notes and vitals signs, including pulse oximetry, reviewed.  Summary of this visit's results, reviewed by myself:  Labs:  Results for orders placed during the hospital encounter of 03/15/13 (from the past 24 hour(s))  URINALYSIS, ROUTINE W REFLEX MICROSCOPIC     Status: Abnormal   Collection Time    03/15/13  2:40 AM      Result Value Ref Range   Color, Urine YELLOW  YELLOW   APPearance CLOUDY (*) CLEAR   Specific Gravity, Urine 1.014  1.005 - 1.030   pH 7.5  5.0 - 8.0   Glucose, UA NEGATIVE  NEGATIVE mg/dL   Hgb urine dipstick NEGATIVE  NEGATIVE   Bilirubin Urine NEGATIVE  NEGATIVE   Ketones, ur NEGATIVE  NEGATIVE mg/dL   Protein, ur NEGATIVE  NEGATIVE mg/dL   Urobilinogen, UA 0.2  0.0 - 1.0 mg/dL   Nitrite NEGATIVE  NEGATIVE   Leukocytes, UA MODERATE (*) NEGATIVE  PREGNANCY, URINE     Status: None   Collection Time    03/15/13  2:40 AM      Result Value Ref Range   Preg Test, Ur NEGATIVE  NEGATIVE  URINE MICROSCOPIC-ADD ON     Status: Abnormal   Collection Time  03/15/13  2:40 AM      Result Value Ref Range   Squamous Epithelial / LPF MANY (*) RARE   WBC, UA 3-6  <3 WBC/hpf   RBC / HPF 0-2  <3 RBC/hpf   Bacteria, UA MANY (*) RARE  CBC WITH DIFFERENTIAL     Status: Abnormal   Collection Time    03/15/13  2:48 AM      Result Value Ref Range   WBC 9.4  4.0 - 10.5 K/uL   RBC 3.81 (*) 3.87 - 5.11 MIL/uL   Hemoglobin 11.9 (*) 12.0 - 15.0 g/dL   HCT 41.3  24.4 - 01.0 %   MCV 94.5  78.0 - 100.0 fL   MCH 31.2  26.0 - 34.0 pg   MCHC 33.1  30.0 - 36.0 g/dL   RDW 27.2  53.6 - 64.4 %   Platelets 382  150 - 400 K/uL   Neutrophils  Relative % 62  43 - 77 %   Neutro Abs 5.8  1.7 - 7.7 K/uL   Lymphocytes Relative 31  12 - 46 %   Lymphs Abs 2.9  0.7 - 4.0 K/uL   Monocytes Relative 6  3 - 12 %   Monocytes Absolute 0.6  0.1 - 1.0 K/uL   Eosinophils Relative 1  0 - 5 %   Eosinophils Absolute 0.1  0.0 - 0.7 K/uL   Basophils Relative 0  0 - 1 %   Basophils Absolute 0.0  0.0 - 0.1 K/uL  I-STAT CHEM 8, ED     Status: Abnormal   Collection Time    03/15/13  2:58 AM      Result Value Ref Range   Sodium 141  137 - 147 mEq/L   Potassium 3.3 (*) 3.7 - 5.3 mEq/L   Chloride 99  96 - 112 mEq/L   BUN 6  6 - 23 mg/dL   Creatinine, Ser 0.34  0.50 - 1.10 mg/dL   Glucose, Bld 88  70 - 99 mg/dL   Calcium, Ion 7.42 (*) 1.12 - 1.23 mmol/L   TCO2 28  0 - 100 mmol/L   Hemoglobin 12.9  12.0 - 15.0 g/dL   HCT 59.5  63.8 - 75.6 %  LIPASE, BLOOD     Status: None   Collection Time    03/15/13  2:58 AM      Result Value Ref Range   Lipase 21  11 - 59 U/L   4:18 AM No significant change with Carafate. No splenomegaly or significant splenic tenderness palpated on exam. A CT scan at this time would likely be nondiagnostic and wish to avoid unnecessary radiation exposure. We will treat with Carafate and PPI and have her followup with her primary care physician.    Hanley Seamen, MD 03/15/13 [phone]

## 2013-03-15 NOTE — ED Notes (Signed)
Pt with LUQ pain x one month states that pain has increased denies NVD or fever

## 2013-04-29 ENCOUNTER — Emergency Department (HOSPITAL_BASED_OUTPATIENT_CLINIC_OR_DEPARTMENT_OTHER): Payer: No Typology Code available for payment source

## 2013-04-29 ENCOUNTER — Emergency Department (HOSPITAL_BASED_OUTPATIENT_CLINIC_OR_DEPARTMENT_OTHER)
Admission: EM | Admit: 2013-04-29 | Discharge: 2013-04-29 | Disposition: A | Discharge status: Home / Self Care | Payer: No Typology Code available for payment source | Attending: Emergency Medicine | Admitting: Emergency Medicine

## 2013-04-29 ENCOUNTER — Encounter (HOSPITAL_BASED_OUTPATIENT_CLINIC_OR_DEPARTMENT_OTHER): Payer: Self-pay | Admitting: Emergency Medicine

## 2013-04-29 DIAGNOSIS — Y9389 Activity, other specified: Secondary | ICD-10-CM | POA: Insufficient documentation

## 2013-04-29 DIAGNOSIS — Z79899 Other long term (current) drug therapy: Secondary | ICD-10-CM | POA: Insufficient documentation

## 2013-04-29 DIAGNOSIS — S39012A Strain of muscle, fascia and tendon of lower back, initial encounter: Secondary | ICD-10-CM

## 2013-04-29 DIAGNOSIS — S161XXA Strain of muscle, fascia and tendon at neck level, initial encounter: Secondary | ICD-10-CM

## 2013-04-29 DIAGNOSIS — S335XXA Sprain of ligaments of lumbar spine, initial encounter: Secondary | ICD-10-CM | POA: Insufficient documentation

## 2013-04-29 DIAGNOSIS — S139XXA Sprain of joints and ligaments of unspecified parts of neck, initial encounter: Secondary | ICD-10-CM | POA: Insufficient documentation

## 2013-04-29 MED ORDER — CYCLOBENZAPRINE HCL 10 MG PO TABS: 10.0000 mg | ORAL_TABLET | Freq: Three times a day (TID) | ORAL | Status: DC | PRN
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
## 2013-11-10 ENCOUNTER — Encounter (HOSPITAL_BASED_OUTPATIENT_CLINIC_OR_DEPARTMENT_OTHER): Payer: Self-pay | Admitting: Emergency Medicine

## 2014-01-27 ENCOUNTER — Emergency Department (HOSPITAL_BASED_OUTPATIENT_CLINIC_OR_DEPARTMENT_OTHER): Payer: 59

## 2014-01-27 ENCOUNTER — Emergency Department (HOSPITAL_BASED_OUTPATIENT_CLINIC_OR_DEPARTMENT_OTHER)
Admission: EM | Admit: 2014-01-27 | Discharge: 2014-01-27 | Disposition: A | Discharge status: Home / Self Care | Payer: 59 | Attending: Emergency Medicine | Admitting: Emergency Medicine

## 2014-01-27 ENCOUNTER — Encounter (HOSPITAL_BASED_OUTPATIENT_CLINIC_OR_DEPARTMENT_OTHER): Payer: Self-pay | Admitting: Emergency Medicine

## 2014-01-27 DIAGNOSIS — Z79899 Other long term (current) drug therapy: Secondary | ICD-10-CM | POA: Insufficient documentation

## 2014-01-27 DIAGNOSIS — Y9389 Activity, other specified: Secondary | ICD-10-CM | POA: Insufficient documentation

## 2014-01-27 DIAGNOSIS — S93402A Sprain of unspecified ligament of left ankle, initial encounter: Secondary | ICD-10-CM

## 2014-01-27 DIAGNOSIS — S99912A Unspecified injury of left ankle, initial encounter: Secondary | ICD-10-CM | POA: Diagnosis present

## 2014-01-27 DIAGNOSIS — R52 Pain, unspecified: Secondary | ICD-10-CM

## 2014-01-27 DIAGNOSIS — Y998 Other external cause status: Secondary | ICD-10-CM | POA: Diagnosis not present

## 2014-01-27 DIAGNOSIS — Y9289 Other specified places as the place of occurrence of the external cause: Secondary | ICD-10-CM | POA: Diagnosis not present

## 2014-01-27 DIAGNOSIS — X58XXXA Exposure to other specified factors, initial encounter: Secondary | ICD-10-CM | POA: Diagnosis not present

## 2014-01-27 MED ORDER — NAPROXEN 250 MG PO TABS
500.0000 mg | ORAL_TABLET | Freq: Once | ORAL | Status: AC
  Administered 2014-01-27: 500 mg via ORAL
  Filled 2014-01-27: qty 2

## 2014-01-27 MED ORDER — NAPROXEN 500 MG PO TABS: 500.0000 mg | ORAL_TABLET | Freq: Two times a day (BID) | ORAL | Status: DC

## 2014-01-27 NOTE — ED Notes (Addendum)
Patient transported to X-ray via stretcher with tech. 

## 2014-01-27 NOTE — ED Notes (Signed)
Pt states that she rolled her left ankle > 1 month ago and is continuing to have pain and swelling to left ankle

## 2014-01-27 NOTE — ED Notes (Signed)
MD at bedside. 

## 2014-01-27 NOTE — ED Provider Notes (Signed)
CSN: [id number]     Arrival date & time 01/27/14  0115 History   First MD Initiated Contact with Patient 01/27/14 0153     Chief Complaint  Patient presents with  . Ankle Pain     (Consider location/radiation/quality/duration/timing/severity/associated sxs/prior Treatment) Patient is a 39 y.o. female presenting with ankle pain. The history is provided by the patient.  Ankle Pain Location:  Ankle Injury: yes (twisted 2 months ago then reinjured 4 days ago)   Ankle location:  L ankle Pain details:    Radiates to:  Does not radiate   Severity:  Moderate   Onset quality:  Gradual   Timing:  Constant   Progression:  Worsening Chronicity:  New Dislocation: no   Foreign body present:  No foreign bodies Prior injury to area:  Yes Relieved by:  Nothing Worsened by:  Nothing tried Ineffective treatments:  Acetaminophen Associated symptoms: no back pain, no neck pain, no stiffness and no swelling   Risk factors: no concern for non-accidental trauma     History reviewed. No pertinent past medical history. History reviewed. No pertinent past surgical history. Family History  Problem Relation Age of Onset  . Hyperlipidemia Mother    History  Substance Use Topics  . Smoking status: Never Smoker   . Smokeless tobacco: Never Used  . Alcohol Use: No   OB History    Gravida Para Term Preterm AB TAB SAB Ectopic Multiple Living   5    3  3   2      Review of Systems  Musculoskeletal: Negative for back pain, stiffness and neck pain.  Neurological: Negative for weakness.  All other systems reviewed and are negative.     Allergies  Sulfa antibiotics  Home Medications   Prior to Admission medications   Medication Sig Start Date End Date Taking? Authorizing Provider  cyclobenzaprine (FLEXERIL) 10 MG tablet Take 1 tablet (10 mg total) by mouth 3 (three) times daily as needed for muscle spasms. 04/29/13   Geoffery Lyonsouglas Delo, MD  omeprazole (PRILOSEC) 40 MG capsule Take 1 capsule (40 mg  total) by mouth daily. 03/15/13   John L Molpus, MD  sucralfate (CARAFATE) 1 G tablet Take 1 tablet (1 g total) by mouth 4 (four) times daily -  with meals and at bedtime. 03/15/13   John L Molpus, MD   BP 123/67 mmHg  Pulse 82  Temp(Src) 98.1 F (36.7 C) (Oral)  Resp 18  Ht 5\' 1"  (1.549 m)  Wt 159 lb (72.122 kg)  BMI 30.06 kg/m2  SpO2 100%  LMP 01/13/2014 Physical Exam  Constitutional: She is oriented to person, place, and time. She appears well-developed and well-nourished. No distress.  HENT:  Head: Normocephalic and atraumatic.  Mouth/Throat: Oropharynx is clear and moist.  Eyes: Conjunctivae are normal. Pupils are equal, round, and reactive to light.  Neck: Normal range of motion. Neck supple.  Cardiovascular: Normal rate, regular rhythm and intact distal pulses.   Pulmonary/Chest: Effort normal and breath sounds normal. No respiratory distress. She has no wheezes. She has no rales.  Abdominal: Soft. Bowel sounds are normal. There is no tenderness. There is no rebound and no guarding.  Musculoskeletal: Normal range of motion.       Left ankle: Normal. She exhibits normal range of motion, no swelling, no ecchymosis, no deformity, no laceration and normal pulse. No tenderness. No lateral malleolus, no medial malleolus, no AITFL, no CF ligament, no posterior TFL, no head of 5th metatarsal and no proximal fibula  tenderness found. Achilles tendon normal.  Neurological: She is alert and oriented to person, place, and time. She has normal reflexes.  Skin: Skin is warm and dry.  Psychiatric: She has a normal mood and affect.    ED Course  Procedures (including critical care time) Labs Review Labs Reviewed - No data to display  Imaging Review Dg Ankle Complete Left  01/27/2014   CLINICAL DATA:  Pt states that she rolled her left ankle in November and is continuing to have pain and swelling. Pain is posterior and lateral.  EXAM: LEFT ANKLE COMPLETE - 3+ VIEW  COMPARISON:  None.  FINDINGS:  There is no evidence of fracture, dislocation, or joint effusion. There is no evidence of arthropathy or other focal bone abnormality. Mild soft tissue swelling most evident laterally.  IMPRESSION: No fracture or ankle joint abnormality.   Electronically Signed   By: Amie Portland M.D.   On: 01/27/2014 01:47     EKG Interpretation None      MDM   Final diagnoses:  Pain    Reinjury of ankle sprain.  ASO ice and elevation and follow up with foot and ankle specialist.     Corean Yoshimura K Kentavious Michele-Rasch, MD 01/27/14 [phone]

## 2015-06-05 ENCOUNTER — Emergency Department (HOSPITAL_BASED_OUTPATIENT_CLINIC_OR_DEPARTMENT_OTHER)
Admission: EM | Admit: 2015-06-05 | Discharge: 2015-06-05 | Disposition: A | Discharge status: Home / Self Care | Payer: 59 | Attending: Emergency Medicine | Admitting: Emergency Medicine

## 2015-06-05 ENCOUNTER — Encounter (HOSPITAL_BASED_OUTPATIENT_CLINIC_OR_DEPARTMENT_OTHER): Payer: Self-pay | Admitting: *Deleted

## 2015-06-05 DIAGNOSIS — R519 Headache, unspecified: Secondary | ICD-10-CM

## 2015-06-05 DIAGNOSIS — B349 Viral infection, unspecified: Secondary | ICD-10-CM

## 2015-06-05 DIAGNOSIS — Z791 Long term (current) use of non-steroidal anti-inflammatories (NSAID): Secondary | ICD-10-CM | POA: Insufficient documentation

## 2015-06-05 DIAGNOSIS — R51 Headache: Secondary | ICD-10-CM

## 2015-06-05 LAB — CBC WITH DIFFERENTIAL/PLATELET
BASOS ABS: 0 10*3/uL (ref 0.0–0.1)
Basophils Relative: 0 %
EOS PCT: 1 %
Eosinophils Absolute: 0.1 10*3/uL (ref 0.0–0.7)
HEMATOCRIT: 36.8 % (ref 36.0–46.0)
Hemoglobin: 12.3 g/dL (ref 12.0–15.0)
LYMPHS ABS: 2.3 10*3/uL (ref 0.7–4.0)
LYMPHS PCT: 23 %
MCH: 31.4 pg (ref 26.0–34.0)
MCHC: 33.4 g/dL (ref 30.0–36.0)
MCV: 93.9 fL (ref 78.0–100.0)
MONO ABS: 0.6 10*3/uL (ref 0.1–1.0)
MONOS PCT: 6 %
NEUTROS ABS: 6.7 10*3/uL (ref 1.7–7.7)
Neutrophils Relative %: 70 %
PLATELETS: 439 10*3/uL — AB (ref 150–400)
RBC: 3.92 MIL/uL (ref 3.87–5.11)
RDW: 12.7 % (ref 11.5–15.5)
WBC: 9.7 10*3/uL (ref 4.0–10.5)

## 2015-06-05 LAB — BASIC METABOLIC PANEL
Anion gap: 7 (ref 5–15)
BUN: 14 mg/dL (ref 6–20)
CO2: 27 mmol/L (ref 22–32)
Calcium: 9.7 mg/dL (ref 8.9–10.3)
Chloride: 104 mmol/L (ref 101–111)
Creatinine, Ser: 0.81 mg/dL (ref 0.44–1.00)
GFR calc Af Amer: >60 mL/min (ref >60)
GFR calc non Af Amer: >60 mL/min (ref >60)
GLUCOSE: 115 mg/dL — AB (ref 65–99)
POTASSIUM: 3.9 mmol/L (ref 3.5–5.1)
Sodium: 138 mmol/L (ref 135–145)

## 2015-06-05 LAB — URINALYSIS, ROUTINE W REFLEX MICROSCOPIC
BILIRUBIN URINE: NEGATIVE
GLUCOSE, UA: NEGATIVE mg/dL
Hgb urine dipstick: NEGATIVE
Ketones, ur: NEGATIVE mg/dL
Leukocytes, UA: NEGATIVE
Nitrite: NEGATIVE
PH: 7.5 (ref 5.0–8.0)
Protein, ur: NEGATIVE mg/dL
Specific Gravity, Urine: 1.014 (ref 1.005–1.030)

## 2015-06-05 LAB — PREGNANCY, URINE: Preg Test, Ur: NEGATIVE

## 2015-06-05 MED ORDER — KETOROLAC TROMETHAMINE 30 MG/ML IJ SOLN
30.0000 mg | Freq: Once | INTRAMUSCULAR | Status: AC
  Administered 2015-06-05: 30 mg via INTRAVENOUS
  Filled 2015-06-05: qty 1

## 2015-06-05 MED ORDER — HYDROCODONE-ACETAMINOPHEN 5-325 MG PO TABS
1.0000 | ORAL_TABLET | Freq: Once | ORAL | Status: AC
  Administered 2015-06-05: 1 via ORAL
  Filled 2015-06-05: qty 1

## 2015-06-05 MED ORDER — SODIUM CHLORIDE 0.9 % IV BOLUS (SEPSIS)
1000.0000 mL | Freq: Once | INTRAVENOUS | Status: AC
  Administered 2015-06-05: 1000 mL via INTRAVENOUS

## 2015-06-05 NOTE — ED Provider Notes (Signed)
CSN: [id number]     Arrival date & time 06/05/15  0051 History   First MD Initiated Contact with Patient 06/05/15 0122     Chief Complaint  Patient presents with  . Headache     (Consider location/radiation/quality/duration/timing/severity/associated sxs/prior Treatment) HPI Comments: Patient is a 40 year old female who presents with complaints of headache and body aches for the past several days. She denies cough, visual disturbances. She is found in triage to be febrile with a temp of 100.1, but denies fevers at home. 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 She denies any urinary complaints. She denies any stiff neck.  Patient is a 40 y.o. female presenting with headaches. The history is provided by the patient.  Headache Pain location:  Generalized Quality:  Dull Radiates to:  Does not radiate Onset quality:  Sudden Duration:  2 days Progression:  Worsening Chronicity:  New Similar to prior headaches: no   Relieved by:  Nothing Worsened by:  Activity Ineffective treatments:  None tried   History reviewed. No pertinent past medical history. History reviewed. No pertinent past surgical history. Family History  Problem Relation Age of Onset  . Hyperlipidemia Mother    Social History  Substance Use Topics  . Smoking status: Never Smoker   . Smokeless tobacco: Never Used  . Alcohol Use: No   OB History    Gravida Para Term Preterm AB TAB SAB Ectopic Multiple Living   Review of Systems  Neurological: Positive for headaches.  All other systems reviewed and are negative.     Allergies  Sulfa antibiotics  Home Medications   Prior to Admission medications   Medication Sig Start Date End Date Taking? Authorizing Provider  ibuprofen (ADVIL,MOTRIN) 600 MG tablet Take 600 mg by mouth every 6 (six) hours as needed.   Yes Historical Provider, MD  naproxen (NAPROSYN) 500 MG tablet Take 1 tablet (500 mg total) by  mouth 2 (two) times daily with a meal. 01/27/14   April Palumbo, MD  omeprazole (PRILOSEC) 40 MG capsule Take 1 capsule (40 mg total) by mouth daily. 03/15/13   John Molpus, MD  sucralfate (CARAFATE) 1 G tablet Take 1 tablet (1 g total) by mouth 4 (four) times daily -  with meals and at bedtime. 03/15/13   John Molpus, MD   BP 126/80 mmHg  Pulse 95  Temp(Src) 100.1 F (37.8 C) (Oral)  Resp 16  Ht  (1.549 m)  Wt 159 lb (72.122 kg)  BMI 30.06 kg/m2  SpO2 100%  LMP 06/02/2015 Physical Exam  Constitutional: She is oriented to person, place, and time. She appears well-developed and well-nourished. No distress.  HENT:  Head: Normocephalic and atraumatic.  Mouth/Throat: Oropharynx is clear and moist.  Eyes: EOM are normal. Pupils are equal, round, and reactive to light.  Neck: Normal range of motion. Neck supple.  Cardiovascular: Normal rate and regular rhythm.  Exam reveals no gallop and no friction rub.   No murmur heard. Pulmonary/Chest: Effort normal and breath sounds normal. No respiratory distress. She has no wheezes.  Abdominal: Soft. Bowel sounds are normal. She exhibits no distension. There is no tenderness.  Musculoskeletal: Normal range of motion. She exhibits no edema.  Neurological: She is alert and oriented to person, place, and time. No cranial nerve deficit. She exhibits normal muscle tone. Coordination normal.  Skin: Skin is warm and dry. She is  not diaphoretic.  Nursing note and vitals reviewed.   ED Course  Procedures (including critical care time) Labs Review Labs Reviewed  BASIC METABOLIC PANEL  CBC WITH DIFFERENTIAL/PLATELET  URINALYSIS, ROUTINE W REFLEX MICROSCOPIC (NOT [facility])  PREGNANCY, URINE    Imaging Review No results found. I have personally reviewed and evaluated these images and lab results as part of my medical decision-making.    MDM   Final diagnoses:  None    Patient feeling better after IV fluids and Toradol. Her physical examination  is reassuring. She does not appear meningitic or toxic. Due to the low-grade fever, I suspect her symptoms are likely viral in nature. She will be advised to take Tylenol and ibuprofen as needed and return for any problems.    Geoffery Lyonsouglas Jahlil Ziller, MD 06/05/15 Earle [id number]

## 2015-06-05 NOTE — Discharge Instructions (Signed)
Tylenol 1000 mg rotated with ibuprofen 600 mg every 4 hours as needed for pain or fever.  Return to the ER if your symptoms significantly worsen or change.   General Headache Without Cause A headache is pain or discomfort felt around the head or neck area. The specific cause of a headache may not be found. There are many causes and types of headaches. A few common ones are:  Tension headaches.  Migraine headaches.  Cluster headaches.  Chronic daily headaches. HOME CARE INSTRUCTIONS  Watch your condition for any changes. Take these steps to help with your condition: Managing Pain  Take over-the-counter and prescription medicines only as told by your health care provider.  Lie down in a dark, quiet room when you have a headache.  If directed, apply ice to the head and neck area:  Put ice in a plastic bag.  Place a towel between your skin and the bag.  Leave the ice on for 20 minutes, 2-3 times per day.  Use a heating pad or hot shower to apply heat to the head and neck area as told by your health care provider.  Keep lights dim if bright lights bother you or make your headaches worse. Eating and Drinking  Eat meals on a regular schedule.  Limit alcohol use.  Decrease the amount of caffeine you drink, or stop drinking caffeine. General Instructions  Keep all follow-up visits as told by your health care provider. This is important.  Keep a headache journal to help find out what may trigger your headaches. For example, write down:  What you eat and drink.  How much sleep you get.  Any change to your diet or medicines.  Try massage or other relaxation techniques.  Limit stress.  Sit up straight, and do not tense your muscles.  Do not use tobacco products, including cigarettes, chewing tobacco, or e-cigarettes. If you need help quitting, ask your health care provider.  Exercise regularly as told by your health care provider.  Sleep on a regular schedule. Get  7-9 hours of sleep, or the amount recommended by your health care provider. SEEK MEDICAL CARE IF:   Your symptoms are not helped by medicine.  You have a headache that is different from the usual headache.  You have nausea or you vomit.  You have a fever. SEEK IMMEDIATE MEDICAL CARE IF:   Your headache becomes severe.  You have repeated vomiting.  You have a stiff neck.  You have a loss of vision.  You have problems with speech.  You have pain in the eye or ear.  You have muscular weakness or loss of muscle control.  You lose your balance or have trouble walking.  You feel faint or pass out.  You have confusion.   This information is not intended to replace advice given to you by your health care provider. Make sure you discuss any questions you have with your health care provider.   Document Released: 12/26/2004 Document Revised: 09/16/2014 Document Reviewed: 04/20/2014 Elsevier Interactive Patient Education [employer]2016 [employer]

## 2015-06-05 NOTE — ED Notes (Signed)
Pt c/o h/a x 3 days and body aches .

## 2015-06-05 NOTE — ED Notes (Signed)
MD at bedside. 

## 2015-08-12 ENCOUNTER — Other Ambulatory Visit: Payer: Self-pay | Admitting: Obstetrics and Gynecology

## 2015-08-12 DIAGNOSIS — N644 Mastodynia: Secondary | ICD-10-CM

## 2015-08-16 ENCOUNTER — Other Ambulatory Visit: Payer: Self-pay | Admitting: Obstetrics and Gynecology

## 2015-08-16 ENCOUNTER — Other Ambulatory Visit: Payer: Self-pay

## 2015-08-16 DIAGNOSIS — N644 Mastodynia: Secondary | ICD-10-CM

## 2015-08-17 ENCOUNTER — Other Ambulatory Visit: Payer: 59

## 2015-08-24 ENCOUNTER — Other Ambulatory Visit: Payer: 59

## 2015-08-25 ENCOUNTER — Ambulatory Visit
Admission: RE | Admit: 2015-08-25 | Discharge: 2015-08-25 | Disposition: A | Discharge status: Home / Self Care | Payer: No Typology Code available for payment source | Source: Ambulatory Visit | Attending: Obstetrics and Gynecology | Admitting: Obstetrics and Gynecology

## 2015-08-25 DIAGNOSIS — N644 Mastodynia: Secondary | ICD-10-CM

## 2016-08-14 ENCOUNTER — Encounter (HOSPITAL_BASED_OUTPATIENT_CLINIC_OR_DEPARTMENT_OTHER): Payer: Self-pay | Admitting: *Deleted

## 2016-08-14 ENCOUNTER — Emergency Department (HOSPITAL_BASED_OUTPATIENT_CLINIC_OR_DEPARTMENT_OTHER)
Admission: EM | Admit: 2016-08-14 | Discharge: 2016-08-15 | Disposition: A | Discharge status: Home / Self Care | Payer: BLUE CROSS/BLUE SHIELD | Attending: Emergency Medicine | Admitting: Emergency Medicine

## 2016-08-14 ENCOUNTER — Emergency Department (HOSPITAL_BASED_OUTPATIENT_CLINIC_OR_DEPARTMENT_OTHER): Payer: BLUE CROSS/BLUE SHIELD

## 2016-08-14 DIAGNOSIS — R059 Cough, unspecified: Secondary | ICD-10-CM

## 2016-08-14 DIAGNOSIS — R05 Cough: Secondary | ICD-10-CM | POA: Insufficient documentation

## 2016-08-14 DIAGNOSIS — Z79899 Other long term (current) drug therapy: Secondary | ICD-10-CM | POA: Insufficient documentation

## 2016-08-14 DIAGNOSIS — R0602 Shortness of breath: Secondary | ICD-10-CM | POA: Diagnosis present

## 2016-08-14 NOTE — ED Triage Notes (Signed)
Pt c/o 2 weeks of  URI symptoms and chest pain/ SOb x 2 days

## 2016-08-14 NOTE — ED Notes (Signed)
Patient transported to X-ray 

## 2016-08-15 MED ORDER — PREDNISONE 50 MG PO TABS
60.0000 mg | ORAL_TABLET | Freq: Once | ORAL | Status: AC
  Administered 2016-08-15: 60 mg via ORAL
  Filled 2016-08-15: qty 1

## 2016-08-15 MED ORDER — ALBUTEROL SULFATE HFA 108 (90 BASE) MCG/ACT IN AERS: 1.0000 | INHALATION_SPRAY | Freq: Four times a day (QID) | RESPIRATORY_TRACT | 0 refills | Status: AC | PRN

## 2016-08-15 MED ORDER — BENZONATATE 100 MG PO CAPS: 100.0000 mg | ORAL_CAPSULE | Freq: Three times a day (TID) | ORAL | 0 refills | Status: AC

## 2016-08-15 MED ORDER — BENZONATATE 100 MG PO CAPS
100.0000 mg | ORAL_CAPSULE | Freq: Once | ORAL | Status: AC
  Administered 2016-08-15: 100 mg via ORAL
  Filled 2016-08-15: qty 1

## 2016-08-15 MED ORDER — PREDNISONE 20 MG PO TABS: 40.0000 mg | ORAL_TABLET | Freq: Every day | ORAL | 0 refills | Status: AC
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
## 2016-08-15 NOTE — Discharge Instructions (Signed)
Use inhaler as needed for wheezing and SOB Take steroid once a day Take Tessalon as needed for cough Take Cetirizine (Zyrtec) for congestion

## 2021-05-31 ENCOUNTER — Other Ambulatory Visit: Payer: Self-pay

## 2021-05-31 ENCOUNTER — Emergency Department (HOSPITAL_COMMUNITY): Payer: No Typology Code available for payment source

## 2021-05-31 ENCOUNTER — Encounter (HOSPITAL_COMMUNITY): Payer: Self-pay | Admitting: Emergency Medicine

## 2021-05-31 ENCOUNTER — Emergency Department (HOSPITAL_COMMUNITY)
Admission: EM | Admit: 2021-05-31 | Discharge: 2021-05-31 | Disposition: A | Discharge status: Home / Self Care | Payer: No Typology Code available for payment source | Attending: Emergency Medicine | Admitting: Emergency Medicine

## 2021-05-31 DIAGNOSIS — Y92481 Parking lot as the place of occurrence of the external cause: Secondary | ICD-10-CM | POA: Insufficient documentation

## 2021-05-31 DIAGNOSIS — S6992XA Unspecified injury of left wrist, hand and finger(s), initial encounter: Secondary | ICD-10-CM | POA: Insufficient documentation

## 2021-05-31 DIAGNOSIS — S8001XA Contusion of right knee, initial encounter: Secondary | ICD-10-CM | POA: Insufficient documentation

## 2021-05-31 DIAGNOSIS — M79645 Pain in left finger(s): Secondary | ICD-10-CM

## 2021-05-31 DIAGNOSIS — S8991XA Unspecified injury of right lower leg, initial encounter: Secondary | ICD-10-CM | POA: Diagnosis present

## 2021-05-31 MED ORDER — METHOCARBAMOL 500 MG PO TABS: 500.0000 mg | ORAL_TABLET | Freq: Two times a day (BID) | ORAL | 0 refills | Status: DC

## 2021-05-31 MED ORDER — IBUPROFEN 600 MG PO TABS: 600.0000 mg | ORAL_TABLET | Freq: Three times a day (TID) | ORAL | 0 refills | Status: DC

## 2021-05-31 MED ORDER — IBUPROFEN 400 MG PO TABS
400.0000 mg | ORAL_TABLET | Freq: Once | ORAL | Status: AC
  Administered 2021-05-31: 400 mg via ORAL
  Filled 2021-05-31: qty 1
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
## 2022-03-08 ENCOUNTER — Other Ambulatory Visit (HOSPITAL_BASED_OUTPATIENT_CLINIC_OR_DEPARTMENT_OTHER): Payer: Self-pay

## 2022-03-08 ENCOUNTER — Encounter (HOSPITAL_BASED_OUTPATIENT_CLINIC_OR_DEPARTMENT_OTHER): Payer: Self-pay

## 2022-03-08 ENCOUNTER — Emergency Department (HOSPITAL_BASED_OUTPATIENT_CLINIC_OR_DEPARTMENT_OTHER)
Admission: EM | Admit: 2022-03-08 | Discharge: 2022-03-08 | Disposition: A | Discharge status: Home / Self Care | Payer: Commercial Managed Care - HMO | Attending: Emergency Medicine | Admitting: Emergency Medicine

## 2022-03-08 ENCOUNTER — Other Ambulatory Visit: Payer: Self-pay

## 2022-03-08 ENCOUNTER — Emergency Department (HOSPITAL_BASED_OUTPATIENT_CLINIC_OR_DEPARTMENT_OTHER): Payer: Commercial Managed Care - HMO

## 2022-03-08 DIAGNOSIS — K8020 Calculus of gallbladder without cholecystitis without obstruction: Secondary | ICD-10-CM | POA: Diagnosis not present

## 2022-03-08 DIAGNOSIS — R109 Unspecified abdominal pain: Secondary | ICD-10-CM

## 2022-03-08 DIAGNOSIS — R1032 Left lower quadrant pain: Secondary | ICD-10-CM | POA: Diagnosis present

## 2022-03-08 LAB — CBC
HCT: 34.4 % — ABNORMAL LOW (ref 36.0–46.0)
Hemoglobin: 11.3 g/dL — ABNORMAL LOW (ref 12.0–15.0)
MCH: 30.2 pg (ref 26.0–34.0)
MCHC: 32.8 g/dL (ref 30.0–36.0)
MCV: 92 fL (ref 80.0–100.0)
Platelets: 357 10*3/uL (ref 150–400)
RBC: 3.74 MIL/uL — ABNORMAL LOW (ref 3.87–5.11)
RDW: 13.2 % (ref 11.5–15.5)
WBC: 4.6 10*3/uL (ref 4.0–10.5)
nRBC: 0 % (ref 0.0–0.2)

## 2022-03-08 LAB — COMPREHENSIVE METABOLIC PANEL
ALT: 11 U/L (ref 0–44)
AST: 12 U/L — ABNORMAL LOW (ref 15–41)
Albumin: 4.1 g/dL (ref 3.5–5.0)
Alkaline Phosphatase: 54 U/L (ref 38–126)
Anion gap: 9 (ref 5–15)
BUN: 9 mg/dL (ref 6–20)
CO2: 24 mmol/L (ref 22–32)
Calcium: 9 mg/dL (ref 8.9–10.3)
Chloride: 105 mmol/L (ref 98–111)
Creatinine, Ser: 0.89 mg/dL (ref 0.44–1.00)
GFR, Estimated: >60 mL/min (ref >60)
Glucose, Bld: 109 mg/dL — ABNORMAL HIGH (ref 70–99)
Potassium: 3.6 mmol/L (ref 3.5–5.1)
Sodium: 138 mmol/L (ref 135–145)
Total Bilirubin: 0.2 mg/dL — ABNORMAL LOW (ref 0.3–1.2)
Total Protein: 7 g/dL (ref 6.5–8.1)

## 2022-03-08 LAB — URINALYSIS, ROUTINE W REFLEX MICROSCOPIC
Bilirubin Urine: NEGATIVE
Glucose, UA: NEGATIVE mg/dL
Hgb urine dipstick: NEGATIVE
Ketones, ur: NEGATIVE mg/dL
Leukocytes,Ua: NEGATIVE
Nitrite: NEGATIVE
Protein, ur: NEGATIVE mg/dL
Specific Gravity, Urine: >1.03 — ABNORMAL HIGH (ref 1.005–1.030)
pH: 6 (ref 5.0–8.0)

## 2022-03-08 LAB — LIPASE, BLOOD: Lipase: 12 U/L (ref 11–51)

## 2022-03-08 LAB — PREGNANCY, URINE: Preg Test, Ur: NEGATIVE

## 2022-03-08 MED ORDER — METHOCARBAMOL 500 MG PO TABS
500.0000 mg | ORAL_TABLET | Freq: Two times a day (BID) | ORAL | 0 refills | Status: AC
  Filled 2022-03-08: qty 20, 10d supply, fill #0

## 2022-03-08 MED ORDER — FAMOTIDINE 20 MG PO TABS
20.0000 mg | ORAL_TABLET | Freq: Every day | ORAL | 0 refills | Status: AC
  Filled 2022-03-08: qty 30, 30d supply, fill #0

## 2022-03-08 MED ORDER — PANTOPRAZOLE SODIUM 20 MG PO TBEC
20.0000 mg | DELAYED_RELEASE_TABLET | Freq: Every day | ORAL | 0 refills | Status: AC
  Filled 2022-03-08: qty 30, 30d supply, fill #0

## 2022-03-08 MED ORDER — ALUM & MAG HYDROXIDE-SIMETH 200-200-20 MG/5ML PO SUSP
30.0000 mL | Freq: Once | ORAL | Status: AC
  Administered 2022-03-08: 30 mL via ORAL
  Filled 2022-03-08: qty 30

## 2022-03-08 MED ORDER — FENTANYL CITRATE PF 50 MCG/ML IJ SOSY
50.0000 ug | PREFILLED_SYRINGE | Freq: Once | INTRAMUSCULAR | Status: AC
  Administered 2022-03-08: 50 ug via INTRAVENOUS
  Filled 2022-03-08: qty 1

## 2022-03-08 MED ORDER — SUCRALFATE 1 G PO TABS
1.0000 g | ORAL_TABLET | Freq: Three times a day (TID) | ORAL | 0 refills | Status: AC
  Filled 2022-03-08: qty 120, 30d supply, fill #0

## 2022-03-08 NOTE — ED Notes (Signed)
Dc instructions reviewed with patient. Patient voiced understanding. Dc with belongings.  °

## 2022-03-08 NOTE — ED Provider Notes (Signed)
[facility] Provider Note   CSN: [id number] Arrival date & time: 03/08/22  1104     History  Chief Complaint  Patient presents with   Flank Pain    [name] is a 47 y.o. female otherwise healthy female presented for evaluation of left flank pain.  Pain began last night she describes it as a sharp pain moderate intensity, no clear inciting event.  Pain is normally mild and dull occasionally becomes more severe without clear aggravating factors, pain will radiate from her left flank to her left abdomen.  She denies similar pain in the past.  She denies fever, chills, chest pain/shortness of breath, cough/hemoptysis, pleurisy, nausea, vomiting, diarrhea, dysuria/hematuria, history of blood clot, extremity swelling/color change, numbness, tingling, saddle paresthesias or any additional concerns.  HPI     Home Medications Prior to Admission medications   Medication Sig Start Date End Date Taking? Authorizing Provider  famotidine (PEPCID) 20 MG tablet Take 1 tablet (20 mg total) by mouth daily. 03/08/22  Yes Nuala Alpha A, PA-C  methocarbamol (ROBAXIN) 500 MG tablet Take 1 tablet (500 mg total) by mouth 2 (two) times daily. 03/08/22  Yes Nuala Alpha A, PA-C  pantoprazole (PROTONIX) 20 MG tablet Take 1 tablet (20 mg total) by mouth daily. 03/08/22 04/07/22 Yes Nuala Alpha A, PA-C  sucralfate (CARAFATE) 1 g tablet Take 1 tablet (1 g total) by mouth 4 (four) times daily -  with meals and at bedtime. 03/08/22 04/07/22 Yes Nuala Alpha A, PA-C  albuterol (PROVENTIL HFA;VENTOLIN HFA) 108 (90 Base) MCG/ACT inhaler Inhale 1-2 puffs into the lungs every 6 (six) hours as needed for wheezing or shortness of breath. 08/15/16   Recardo Evangelist, PA-C  benzonatate (TESSALON) 100 MG capsule Take 1 capsule (100 mg total) by mouth every 8 (eight) hours. 08/15/16   Recardo Evangelist, PA-C  ibuprofen (ADVIL) 600 MG tablet Take 1 tablet (600 mg total)  by mouth 3 (three) times daily. 05/31/21   Evalee Jefferson, PA-C  predniSONE (DELTASONE) 20 MG tablet Take 2 tablets (40 mg total) by mouth daily. 08/15/16   Recardo Evangelist, PA-C      Allergies    Sulfa antibiotics    Review of Systems   Review of Systems Ten systems are reviewed and are negative for acute change except as noted in the HPI  Physical Exam Updated Vital Signs BP 104/63   Pulse 79   Temp 98.3 F (36.8 C) (Oral)   Resp 17   Ht '5\' 1"'$  (1.549 m)   Wt 72.6 kg   SpO2 97%   BMI 30.23 kg/m  Physical Exam Constitutional:      General: She is not in acute distress.    Appearance: Normal appearance. She is well-developed. She is not ill-appearing or diaphoretic.  HENT:     Head: Normocephalic and atraumatic.  Eyes:     General: Vision grossly intact. Gaze aligned appropriately.     Pupils: Pupils are equal, round, and reactive to light.  Neck:     Trachea: Trachea and phonation normal.  Pulmonary:     Effort: Pulmonary effort is normal. No respiratory distress.  Abdominal:     General: There is no distension.     Palpations: Abdomen is soft.     Tenderness: There is no abdominal tenderness. There is left CVA tenderness. There is no right CVA tenderness, guarding or rebound.  Musculoskeletal:        General: Normal range of  motion.     Cervical back: Normal range of motion.     Right lower leg: No edema.     Left lower leg: No edema.     Comments: No midline spinal tenderness palpation.  No crepitus step-off or deformity spine.  Left paralumbar/left CVA tenderness to palpation.  Appropriate ROM of the lumbar spine.  Capillary refill and sensation intact.  To the legs.  Skin:    General: Skin is warm and dry.  Neurological:     Mental Status: She is alert.     GCS: GCS eye subscore is 4. GCS verbal subscore is 5. GCS motor subscore is 6.     Comments: Speech is clear and goal oriented, follows commands Major Cranial nerves without deficit, no facial droop Moves  extremities without ataxia, coordination intact  Psychiatric:        Behavior: Behavior normal.     ED Results / Procedures / Treatments   Labs (all labs ordered are listed, but only abnormal results are displayed) Labs Reviewed  COMPREHENSIVE METABOLIC PANEL - Abnormal; Notable for the following components:      Result Value   Glucose, Bld 109 (*)    AST 12 (*)    Total Bilirubin 0.2 (*)    All other components within normal limits  CBC - Abnormal; Notable for the following components:   RBC 3.74 (*)    Hemoglobin 11.3 (*)    HCT 34.4 (*)    All other components within normal limits  URINALYSIS, ROUTINE W REFLEX MICROSCOPIC - Abnormal; Notable for the following components:   Color, Urine STRAW (*)    Specific Gravity, Urine >1.030 (*)    All other components within normal limits  LIPASE, BLOOD  PREGNANCY, URINE    EKG EKG Interpretation  Date/Time:  Wednesday March 08 2022 15:37:42 EST Ventricular Rate:  79 PR Interval:  195 QRS Duration: 81 QT Interval:  357 QTC Calculation: 410 R Axis:   53 Text Interpretation: Sinus rhythm Low voltage, precordial leads Confirmed by Octaviano Glow [phone]) on 03/08/2022 3:44:42 PM  Radiology CT Renal Stone Study  Result Date: 03/08/2022 CLINICAL DATA:  Flank pain EXAM: CT ABDOMEN AND PELVIS WITHOUT CONTRAST TECHNIQUE: Multidetector CT imaging of the abdomen and pelvis was performed following the standard protocol without IV contrast. RADIATION DOSE REDUCTION: This exam was performed according to the departmental dose-optimization program which includes automated exposure control, adjustment of the mA and/or kV according to patient size and/or use of iterative reconstruction technique. COMPARISON:  None Available. FINDINGS: Lower chest: There is some linear opacity lung bases likely scar or atelectasis. No pleural effusion. Hepatobiliary: On this non IV contrast exam, the liver is grossly preserved. No obvious space-occupying lesion.  Small stone in the nondilated gallbladder. Pancreas: Unremarkable. No pancreatic ductal dilatation or surrounding inflammatory changes. Spleen: Normal in size without focal abnormality. Adrenals/Urinary Tract: Adrenal glands are preserved. No abnormal calcifications are seen within either kidney nor along the expected course of either ureter. The ureters have normal course and caliber down to bladder bladder is underdistended. Stomach/Bowel: On this non oral contrast exam, the large bowel has a normal course and caliber with scattered stool. Normal appendix extends medial to the cecum in the right lower quadrant anteriorly. Stomach is nondilated. Small bowel is nondilated. There are areas of some small bowel stool appearance distally, nonspecific. Vascular/Lymphatic: Normal caliber aorta and IVC. Minimal vascular calcifications along the aorta distally. No specific abnormal lymph node enlargement identified in the abdomen and  pelvis. Few small mesenteric nodes in the right lower quadrant, nonpathologic by size criteria. Reproductive: Uterus and bilateral adnexa are unremarkable. Trace likely physiologic free fluid in the pelvis. Other: Small fat containing umbilical hernia. Musculoskeletal: Transitional lumbosacral segment. IMPRESSION: No obstructing renal stone.  Normal appendix. Scattered colonic stool. Gallstone in the nondilated gallbladder. Presumed physiologic mild free fluid in the pelvis Electronically Signed   By: Jill Side M.D.   On: 03/08/2022 12:29    Procedures Procedures    Medications Ordered in ED Medications  fentaNYL (SUBLIMAZE) injection 50 mcg (50 mcg Intravenous Given 03/08/22 1223)  alum & mag hydroxide-simeth (MAALOX/MYLANTA) 200-200-20 MG/5ML suspension 30 mL (30 mLs Oral Given 03/08/22 1348)    ED Course/ Medical Decision Making/ A&P                             Medical Decision Making 47 year old female presented for left flank pain onset last night.  No clear inciting  event.  Pain is constant and mild but occasionally becomes more severe without clear aggravating factors.  She is mildly tender at the left costovertebral angle and tender around the paralumbar muscles.  She is no midline spinal tenderness palpation.  She is no neurologic complaint, she has a reassuring neurologic exam.  Low suspicion for cauda equina, myelopathy, spinal epidural abscess.  There is concern due to the nature of her complaint that she may be experiencing kidney stone disease, shared decision making made with patient and her husband regarding CT imaging, patient stated understanding elected proceed.  Will obtain abdominal pain labs along with a urinalysis as well and provide with pain medication.  Differential includes but not limited to kidney stone disease, GERD, pancreatitis, UTI/pyelonephritis, gastritis.  She has no chest pain, shortness of breath, hemoptysis or pleurisy, no history of blood clot and no evidence for DVT.  Low suspicion for PE, ACS, PTX, PNA or other emergent causes of left flank pain.  Amount and/or Complexity of Data Reviewed Labs: ordered.    Details: CBC shows mild anemia of 11.3 similar to prior.  No leukocytosis to suggest infectious process.  No thrombocytopenia. Pregnancy test negative Lipase normal limits, doubt pancreatitis CMP shows no emergent electrolyte derangement, AKI, LFT elevations or gap. Urinalysis without evidence of infection, no RBCs. Radiology: ordered.    Details: I personally interpreted patient CT renal stone study.  I do not appreciate any obvious obstructing stone or hydronephrosis.  No obvious free air to suggest perforation. ECG/medicine tests:     Details: I have personally reviewed and interpreted patient's twelve-lead EKG.  I do not appreciate any obvious acute ischemic changes.  Shows normal sinus rhythm with rate of 79.  Risk OTC drugs. Prescription drug management. Risk Details: Patient was reassessed she is resting comfortably  and in no acute distress.  She is updated on all findings above including the gallstone.  She has no right upper quadrant abdominal pain or postprandial pain to suggest symptomatic cholelithiasis.  She is encouraged to follow-up with her PCP regarding this and we discussed ER precautions regarding gallstones.  She did receive a GI cocktail as she does report a history of GERD and she reports her symptoms resolved afterwards.  Patient could be experiencing acid reflux as cause of her left-sided abdominal and flank pain, we will start patient on Pepcid, Protonix and Carafate for this.  She reports that she often eats late at night and lays down after eating I discussed  at home symptomatic therapies for GERD.  EKG was obtained and is reviewed and does not show any obvious signs for ischemia.  She has no associated chest pain or shortness of breath, no indication for cardiac biomarkers at this time.  Patient is in agreement she would like to be discharged.  Suspect she experiencing acid reflux at this time, low suspicion for cholecystitis, choledocholithiasis, pancreatitis, SBO, perforation or other emergent intra-abdominal causes of her left flank pain.  Additionally suspicion for ACS, PE, dissection or other cardiothoracic causes of left flank pain.  Finally she has a reassuring neurologic exam and no symptoms to suggest cauda equina, spinal epidural abscess, myelopathy or other emergent causes of left flank pain at this time.  Additionally she could be experiencing some musculoskeletal left paralumbar pain and she did ask for muscle relaxer if this is the case I prescribed her Robaxin.  We discussed muscle relaxer precautions and patient stated understanding and not to drive after taking it.  Vital signs stable at time of discharge.    At this time there does not appear to be any evidence of an acute emergency medical condition and the patient appears stable for discharge with appropriate outpatient follow up.  Diagnosis was discussed with patient who verbalizes understanding of care plan and is agreeable to discharge. I have discussed return precautions with patient and husband who verbalizes understanding. Patient encouraged to follow-up with their PCP. All questions answered.  Patient's case discussed with Dr. Langston Masker who agrees with plan to discharge with follow-up.   Note: Portions of this report may have been transcribed using voice recognition software. Every effort was made to ensure accuracy; however, inadvertent computerized transcription errors may still be present.         Final Clinical Impression(s) / ED Diagnoses Final diagnoses:  Left flank pain  Calculus of gallbladder without cholecystitis without obstruction    Rx / DC Orders ED Discharge Orders          Ordered    pantoprazole (PROTONIX) 20 MG tablet  Daily        03/08/22 1548    famotidine (PEPCID) 20 MG tablet  Daily        03/08/22 1548    sucralfate (CARAFATE) 1 g tablet  3 times daily with meals & bedtime        03/08/22 1548    methocarbamol (ROBAXIN) 500 MG tablet  2 times daily        03/08/22 1551              Gari Crown 03/08/22 1556    Wyvonnia Dusky, MD 03/08/22 [id number]

## 2022-03-08 NOTE — Discharge Instructions (Addendum)
At this time there does not appear to be the presence of an emergent medical condition, however there is always the potential for conditions to change. Please read and follow the below instructions.  Please return to the Emergency Department immediately for any new or worsening symptoms . Please be sure to follow up with your Primary Care Provider within one week regarding your visit today; please call their office to schedule an appointment even if you are feeling better for a follow-up visit. As we discussed your CT scan today did show a gallstone.  If you develop any abdominal pain especially pain up in the right upper part of your stomach or pain after eating or vomiting please return to the emergency department for further evaluation. Your CT scan also showed some fluid in your pelvis which can be normal.  Please discuss this with your primary care provider at your follow-up appointment.  Your CT scan did also show a small fat-containing umbilical hernia that you can also discuss with them at your follow-up appointment. You have been prescribed 3 medications, Pepcid, Protonix and Carafate to help with possible acid reflux related symptoms. You may use the muscle relaxer Robaxin as prescribed to help with your symptoms.  Do not drive or operate heavy machinery while taking Robaxin as it will make you drowsy.  Do not drink alcohol or take other sedating medications while taking Robaxin as this will worsen side effects.    Please read the additional information packets attached to your discharge summary.  Go to the nearest Emergency Department immediately if: You have fever or chills You have trouble breathing. You are short of breath. Your belly hurts, or it is swollen or red. You feel like you may vomit (nauseous). You vomit. You feel faint, or you faint. You have blood in your pee. You have chest pain or difficulty breathing You have any new/concerning or worsening of symptoms  Do not  take your medicine if  develop an itchy rash, swelling in your mouth or lips, or difficulty breathing; call 911 and seek immediate emergency medical attention if this occurs.  You may review your lab tests and imaging results in their entirety on your MyChart account.  Please discuss all results of fully with your primary care provider and other specialist at your follow-up visit.  Note: Portions of this text may have been transcribed using voice recognition software. Every effort was made to ensure accuracy; however, inadvertent computerized transcription errors may still be present.

## 2022-03-08 NOTE — ED Triage Notes (Signed)
Patient here POV from Home.  Endorses Right Sided Flank Pain that radiates to Anterior Side since Last PM. Mostly Constant and Mild and worsens at Times.   No N/V/D. No Fevers. No Dysuria.  NAD Noted during Triage. A&Ox4. GCS 15. Ambulatory.

## 2022-03-08 NOTE — ED Notes (Signed)
Awaiting meds from pharmacy  

## 2023-02-05 ENCOUNTER — Emergency Department (HOSPITAL_BASED_OUTPATIENT_CLINIC_OR_DEPARTMENT_OTHER)
Admission: EM | Admit: 2023-02-05 | Discharge: 2023-02-05 | Disposition: A | Discharge status: Home / Self Care | Payer: No Typology Code available for payment source | Attending: Emergency Medicine | Admitting: Emergency Medicine

## 2023-02-05 ENCOUNTER — Emergency Department (HOSPITAL_BASED_OUTPATIENT_CLINIC_OR_DEPARTMENT_OTHER): Payer: No Typology Code available for payment source | Admitting: Radiology

## 2023-02-05 ENCOUNTER — Encounter (HOSPITAL_BASED_OUTPATIENT_CLINIC_OR_DEPARTMENT_OTHER): Payer: Self-pay | Admitting: Emergency Medicine

## 2023-02-05 ENCOUNTER — Other Ambulatory Visit: Payer: Self-pay

## 2023-02-05 DIAGNOSIS — S8391XA Sprain of unspecified site of right knee, initial encounter: Secondary | ICD-10-CM | POA: Diagnosis not present

## 2023-02-05 DIAGNOSIS — M25561 Pain in right knee: Secondary | ICD-10-CM | POA: Diagnosis present

## 2023-02-05 DIAGNOSIS — W009XXA Unspecified fall due to ice and snow, initial encounter: Secondary | ICD-10-CM | POA: Diagnosis not present

## 2023-02-05 MED ORDER — IBUPROFEN 600 MG PO TABS: 600.0000 mg | ORAL_TABLET | Freq: Four times a day (QID) | ORAL | 0 refills | Status: AC

## 2023-02-05 NOTE — Discharge Instructions (Addendum)
Ice and elevate the knee to reduce any swelling. Use crutches to be weight bearing as tolerated.   Follow up with orthopedics for further outpatient evaluation.

## 2023-02-05 NOTE — ED Triage Notes (Signed)
States she fell while walking on ice and landed on both knees. C/o pain in R knee and left ankle. Noted swelling. Able to ambulated.

## 2023-02-05 NOTE — ED Notes (Signed)

## 2023-02-05 NOTE — ED Notes (Signed)
Patient transported to X-ray

## 2023-02-05 NOTE — ED Provider Notes (Signed)
EMERGENCY DEPARTMENT AT [facility] Provider Note   CSN: [id number] Arrival date & time: 02/05/23  1545     History  Chief Complaint  Patient presents with   Knee Pain    [name] is a 48 y.o. female.  Patient fell on the ice 4-5 days ago, landing on both knees. Reports left knee improving but right knee continues to cause pain and swelling.  The history is provided by the patient. No language interpreter was used.  Knee Pain      Home Medications Prior to Admission medications   Medication Sig Start Date End Date Taking? Authorizing Provider  albuterol (PROVENTIL HFA;VENTOLIN HFA) 108 (90 Base) MCG/ACT inhaler Inhale 1-2 puffs into the lungs every 6 (six) hours as needed for wheezing or shortness of breath. 08/15/16   Bethel Born, PA-C  benzonatate (TESSALON) 100 MG capsule Take 1 capsule (100 mg total) by mouth every 8 (eight) hours. 08/15/16   Bethel Born, PA-C  famotidine (PEPCID) 20 MG tablet Take 1 tablet (20 mg total) by mouth daily. 03/08/22   Harlene Salts A, PA-C  ibuprofen (ADVIL) 600 MG tablet Take 1 tablet (600 mg total) by mouth every 6 (six) hours. 02/05/23   Elpidio Anis, PA-C  methocarbamol (ROBAXIN) 500 MG tablet Take 1 tablet (500 mg total) by mouth 2 (two) times daily. 03/08/22   Harlene Salts A, PA-C  pantoprazole (PROTONIX) 20 MG tablet Take 1 tablet (20 mg total) by mouth daily. 03/08/22 04/07/22  Harlene Salts A, PA-C  predniSONE (DELTASONE) 20 MG tablet Take 2 tablets (40 mg total) by mouth daily. 08/15/16   Bethel Born, PA-C  sucralfate (CARAFATE) 1 g tablet Take 1 tablet (1 g total) by mouth 4 (four) times daily -  with meals and at bedtime. 03/08/22 04/07/22  Harlene Salts A, PA-C      Allergies    Sulfa antibiotics    Review of Systems   Review of Systems  Physical Exam Updated Vital Signs BP 134/84 (BP Location: Right Arm)   Pulse 78   Temp 98.7 F (37.1 C) (Oral)   Resp 18   Ht 5\' 1"   (1.549 m)   Wt 72.6 kg   SpO2 98%   BMI 30.24 kg/m  Physical Exam Constitutional:      General: She is not in acute distress.    Appearance: She is well-developed. She is not ill-appearing.  Pulmonary:     Effort: Pulmonary effort is normal.  Musculoskeletal:        General: Normal range of motion.     Cervical back: Normal range of motion.     Comments: Left knee without swelling or discoloration. No bony deformity. No calf or thigh tenderness. Right knee has minimal anterior swelling. Mild laxity on anterior drawer.   Skin:    General: Skin is warm and dry.  Neurological:     Mental Status: She is alert and oriented to person, place, and time.     ED Results / Procedures / Treatments   Labs (all labs ordered are listed, but only abnormal results are displayed) Labs Reviewed - No data to display  EKG None  Radiology DG Knee Complete 4 Views Right Result Date: 02/05/2023 CLINICAL DATA:  Fall.  Right knee pain. EXAM: RIGHT KNEE - COMPLETE 4+ VIEW COMPARISON:  05/31/2021. FINDINGS: No acute fracture or dislocation. No aggressive osseous lesion. There are minimal degenerative changes of the knee joint in the form of mildly reduced medial  tibio-femoral compartment joint space and tibial spiking. No knee effusion or focal soft tissue swelling. No radiopaque foreign bodies. IMPRESSION: No acute osseous abnormality of the right knee. Electronically Signed   By: Jules Schick M.D.   On: 02/05/2023 16:59    Procedures Procedures    Medications Ordered in ED Medications - No data to display  ED Course/ Medical Decision Making/ A&P Clinical Course as of 02/05/23 1719  Mon Feb 05, 2023  1716 Xray of the righ tknee negative. Crutches provided. Will refer to ortho. Ibuprofen regularly.  [SU]    Clinical Course User Index [SU] Elpidio Anis, PA-C                                 Medical Decision Making Amount and/or Complexity of Data Reviewed Radiology:  ordered.           Final Clinical Impression(s) / ED Diagnoses Final diagnoses:  Sprain of right knee, unspecified ligament, initial encounter    Rx / DC Orders ED Discharge Orders          Ordered    ibuprofen (ADVIL) 600 MG tablet  Every 6 hours        02/05/23 1717              Elpidio Anis, PA-C 02/05/23 1719    Anders Simmonds T, DO 02/06/23 2305

## 2023-06-28 ENCOUNTER — Encounter (HOSPITAL_BASED_OUTPATIENT_CLINIC_OR_DEPARTMENT_OTHER): Payer: Self-pay

## 2023-06-28 ENCOUNTER — Other Ambulatory Visit: Payer: Self-pay

## 2023-06-28 ENCOUNTER — Emergency Department (HOSPITAL_BASED_OUTPATIENT_CLINIC_OR_DEPARTMENT_OTHER)

## 2023-06-28 ENCOUNTER — Emergency Department (HOSPITAL_BASED_OUTPATIENT_CLINIC_OR_DEPARTMENT_OTHER)
Admission: EM | Admit: 2023-06-28 | Discharge: 2023-06-28 | Disposition: A | Discharge status: Home / Self Care | Attending: Emergency Medicine | Admitting: Emergency Medicine

## 2023-06-28 DIAGNOSIS — J45909 Unspecified asthma, uncomplicated: Secondary | ICD-10-CM | POA: Insufficient documentation

## 2023-06-28 DIAGNOSIS — R1012 Left upper quadrant pain: Secondary | ICD-10-CM | POA: Insufficient documentation

## 2023-06-28 LAB — CBC WITH DIFFERENTIAL/PLATELET
Abs Immature Granulocytes: 0.02 10*3/uL (ref 0.00–0.07)
Basophils Absolute: 0.1 10*3/uL (ref 0.0–0.1)
Basophils Relative: 1 %
Eosinophils Absolute: 0.4 10*3/uL (ref 0.0–0.5)
Eosinophils Relative: 4 %
HCT: 34.4 % — ABNORMAL LOW (ref 36.0–46.0)
Hemoglobin: 11.4 g/dL — ABNORMAL LOW (ref 12.0–15.0)
Immature Granulocytes: 0 %
Lymphocytes Relative: 27 %
Lymphs Abs: 2.6 10*3/uL (ref 0.7–4.0)
MCH: 30.8 pg (ref 26.0–34.0)
MCHC: 33.1 g/dL (ref 30.0–36.0)
MCV: 93 fL (ref 80.0–100.0)
Monocytes Absolute: 0.6 10*3/uL (ref 0.1–1.0)
Monocytes Relative: 6 %
Neutro Abs: 6 10*3/uL (ref 1.7–7.7)
Neutrophils Relative %: 62 %
Platelets: 429 10*3/uL — ABNORMAL HIGH (ref 150–400)
RBC: 3.7 MIL/uL — ABNORMAL LOW (ref 3.87–5.11)
RDW: 13.4 % (ref 11.5–15.5)
WBC: 9.7 10*3/uL (ref 4.0–10.5)
nRBC: 0 % (ref 0.0–0.2)

## 2023-06-28 LAB — COMPREHENSIVE METABOLIC PANEL WITH GFR
ALT: 7 U/L (ref 0–44)
AST: 13 U/L — ABNORMAL LOW (ref 15–41)
Albumin: 4 g/dL (ref 3.5–5.0)
Alkaline Phosphatase: 80 U/L (ref 38–126)
Anion gap: 12 (ref 5–15)
BUN: 12 mg/dL (ref 6–20)
CO2: 22 mmol/L (ref 22–32)
Calcium: 9.6 mg/dL (ref 8.9–10.3)
Chloride: 104 mmol/L (ref 98–111)
Creatinine, Ser: 1.12 mg/dL — ABNORMAL HIGH (ref 0.44–1.00)
GFR, Estimated: >60 mL/min (ref >60)
Glucose, Bld: 103 mg/dL — ABNORMAL HIGH (ref 70–99)
Potassium: 3.5 mmol/L (ref 3.5–5.1)
Sodium: 138 mmol/L (ref 135–145)
Total Bilirubin: <0.2 mg/dL (ref 0.0–1.2)
Total Protein: 7.1 g/dL (ref 6.5–8.1)

## 2023-06-28 LAB — URINALYSIS, ROUTINE W REFLEX MICROSCOPIC
Bilirubin Urine: NEGATIVE
Glucose, UA: NEGATIVE mg/dL
Hgb urine dipstick: NEGATIVE
Ketones, ur: NEGATIVE mg/dL
Leukocytes,Ua: NEGATIVE
Nitrite: NEGATIVE
Protein, ur: NEGATIVE mg/dL
Specific Gravity, Urine: 1.025 (ref 1.005–1.030)
pH: 5.5 (ref 5.0–8.0)

## 2023-06-28 LAB — LIPASE, BLOOD: Lipase: 23 U/L (ref 11–51)

## 2023-06-28 NOTE — Discharge Instructions (Signed)
Begin taking ibuprofen 600 mg every 6 hours as needed for pain.  Follow-up with primary doctor if not improving in the next few days, and return to the ER if symptoms significantly worsen or change.

## 2023-06-28 NOTE — ED Triage Notes (Signed)
 LUQ abdominal pain, tender to touch, bulging per patient starting last night when she went to lay down on left side and then it felt like tearing or pulling. Pain has stayed pretty steady since then. Denies N/V/D, fever. Everything else feels normal per patient.

## 2023-06-28 NOTE — ED Provider Notes (Signed)
 [place] [facility]GENCY DEPARTMENT AT [facility] Provider Note   CSN: [id number] Arrival date & time: 06/28/23  0147     Patient presents with: Abdominal Pain   [name] is a 48 y.o. female.   This patient is a 48 year old female with history of asthma, GERD.  Patient presenting today with complaints of left upper quadrant pain.  Earlier today she rolled over in bed and felt what she describes as a tear in the left upper abdomen.  She states she has felt a bulge in the area since.  She has pain when she moves and changes position.  No bowel or bladder complaints.  No fevers or chills.       Prior to Admission medications   Medication Sig Start Date End Date Taking? Authorizing Provider  albuterol  (PROVENTIL  HFA;VENTOLIN  HFA) 108 (90 Base) MCG/ACT inhaler Inhale 1-2 puffs into the lungs every 6 (six) hours as needed for wheezing or shortness of breath. 08/15/16   Maryanna Smart, PA-C  benzonatate  (TESSALON ) 100 MG capsule Take 1 capsule (100 mg total) by mouth every 8 (eight) hours. 08/15/16   Maryanna Smart, PA-C  famotidine  (PEPCID ) 20 MG tablet Take 1 tablet (20 mg total) by mouth daily. 03/08/22   Jennifer Moellers, PA-C  ibuprofen  (ADVIL ) 600 MG tablet Take 1 tablet (600 mg total) by mouth every 6 (six) hours. 02/05/23   Mandy Second, PA-C  methocarbamol  (ROBAXIN ) 500 MG tablet Take 1 tablet (500 mg total) by mouth 2 (two) times daily. 03/08/22   Hadassah Letters A, PA-C  pantoprazole  (PROTONIX ) 20 MG tablet Take 1 tablet (20 mg total) by mouth daily. 03/08/22 04/07/22  Hadassah Letters A, PA-C  predniSONE  (DELTASONE ) 20 MG tablet Take 2 tablets (40 mg total) by mouth daily. 08/15/16   Maryanna Smart, PA-C  sucralfate  (CARAFATE ) 1 g tablet Take 1 tablet (1 g total) by mouth 4 (four) times daily -  with meals and at bedtime. 03/08/22 04/07/22  Hadassah Letters A, PA-C    Allergies: Sulfa antibiotics    Review of Systems  All other systems reviewed and are  negative.   Updated Vital Signs BP 139/78 (BP Location: Right Arm)   Pulse 87   Temp 98 F (36.7 C) (Oral)   Resp 16   Ht 5' 1 (1.549 m)   Wt 77.1 kg   SpO2 100%   BMI 32.12 kg/m   Physical Exam Vitals and nursing note reviewed.  Constitutional:      General: She is not in acute distress.    Appearance: She is well-developed. She is not diaphoretic.  HENT:     Head: Normocephalic and atraumatic.   Cardiovascular:     Rate and Rhythm: Normal rate and regular rhythm.     Heart sounds: No murmur heard.    No friction rub. No gallop.  Pulmonary:     Effort: Pulmonary effort is normal. No respiratory distress.     Breath sounds: Normal breath sounds. No wheezing.  Abdominal:     General: Bowel sounds are normal. There is no distension.     Palpations: Abdomen is soft.     Tenderness: There is abdominal tenderness in the left upper quadrant. There is no right CVA tenderness, left CVA tenderness, guarding or rebound.     Comments: There is tenderness in the left upper quadrant.  I am unable to appreciate a bulge or other abnormality.   Musculoskeletal:        General: Normal range  of motion.     Cervical back: Normal range of motion and neck supple.   Skin:    General: Skin is warm and dry.   Neurological:     General: No focal deficit present.     Mental Status: She is alert and oriented to person, place, and time.     (all labs ordered are listed, but only abnormal results are displayed) Labs Reviewed  COMPREHENSIVE METABOLIC PANEL WITH GFR  CBC WITH DIFFERENTIAL/PLATELET  LIPASE, BLOOD  URINALYSIS, ROUTINE W REFLEX MICROSCOPIC    EKG: None  Radiology: No results found.   Procedures   Medications Ordered in the ED - No data to display                                  Medical Decision Making Amount and/or Complexity of Data Reviewed Labs: ordered. Radiology: ordered.  Patient is a 48 year old female presenting with pain to the left upper  quadrant.  She experienced what she describes as a tear when turning over in bed.  Patient arrives with stable vital signs and is afebrile.  Abdomen is benign.  Laboratory studies obtained including CBC, CMP, and lipase, all of which are unremarkable.  Urinalysis is clear.  Renal CT obtained showing no acute abnormality, only cholelithiasis.  Cause of her discomfort appears musculoskeletal.  Patient to be discharged with NSAIDs, rest, and follow-up as needed.      Final diagnoses:  None    ED Discharge Orders     None          Orvilla Blander, MD 06/28/23 [phone]
